# Patient Record
Sex: Female | Born: 1995 | Race: White | Hispanic: No | Marital: Married | Smoking: Current every day smoker
Health system: Southern US, Community
[De-identification: ages and names within clinical notes are randomized; demographics above are authoritative.]

## PROBLEM LIST (undated history)

## (undated) ENCOUNTER — Inpatient Hospital Stay: Payer: Self-pay

## (undated) DIAGNOSIS — F419 Anxiety disorder, unspecified: Secondary | ICD-10-CM

## (undated) DIAGNOSIS — G5600 Carpal tunnel syndrome, unspecified upper limb: Secondary | ICD-10-CM

## (undated) DIAGNOSIS — F329 Major depressive disorder, single episode, unspecified: Secondary | ICD-10-CM

## (undated) DIAGNOSIS — I10 Essential (primary) hypertension: Secondary | ICD-10-CM

## (undated) DIAGNOSIS — F32A Depression, unspecified: Secondary | ICD-10-CM

## (undated) HISTORY — DX: Essential (primary) hypertension: I10

## (undated) HISTORY — PX: APPENDECTOMY: SHX54

---
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
## 2015-02-13 ENCOUNTER — Emergency Department: Payer: Self-pay | Admitting: Emergency Medicine

## 2015-04-07 NOTE — H&P (Signed)
Subjective/Chief Complaint abdominal pain   History of Present Illness 16 yowf w/ 24 hour h/o RLQ abdominal pain and anorexia. Last meal 24 hours ago. No nausea until after CT tonight. No fever. Has some pain with urination.   Past History Klebsiella UTI 12/2012   Past Med/Surgical Hx:  Scoliosis:   Anxiety:   Attention Deficit Hyperactivity Disorder:   left arm fx:   Tonsillectomy and Adenoidectomy:   ALLERGIES:  No Known Allergies:   HOME MEDICATIONS: Medication Instructions Status  FLUoxetine 20 mg oral tablet 1 tab(s) orally once a day Active  Cipro 250 mg oral tablet 1 tab(s) orally every 12 hours Active   Family and Social History:  Family History Non-Contributory   Social History negative tobacco, negative ETOH, negative Illicit drugs   Place of Living Home   Review of Systems:  Fever/Chills No   Cough No   Sputum No   Abdominal Pain Yes   Diarrhea No   Constipation No   Nausea/Vomiting Yes   SOB/DOE No   Chest Pain No   Dysuria Yes   Tolerating PT Yes   Tolerating Diet No  Nauseated   Medications/Allergies Reviewed Medications/Allergies reviewed   Physical Exam:  GEN well developed, well nourished, obese   HEENT pink conjunctivae, PERRL, hearing intact to voice, moist oral mucosa   NECK supple  No masses  thyroid not tender  trachea midline   RESP normal resp effort  clear BS   CARD regular rate  no murmur  no JVD  no Rub   ABD positive tenderness  soft  mild RLQ tenderness   LYMPH negative neck   EXTR negative cyanosis/clubbing, negative edema   SKIN normal to palpation, No ulcers, skin turgor good   NEURO cranial nerves intact, follows commands, motor/sensory function intact   PSYCH alert, A+O to time, place, person   Lab Results: Hepatic:  05-Mar-14 14:36   Bilirubin, Total 0.5  Alkaline Phosphatase 107  SGPT (ALT) 19  SGOT (AST) 18  Total Protein, Serum 7.8  Albumin, Serum 4.0  Routine Chem:  05-Mar-14 14:36    Glucose, Serum 81  BUN 12  Creatinine (comp) 0.69  Sodium, Serum 139  Potassium, Serum 3.8  Chloride, Serum  111  CO2, Serum 22  Calcium (Total), Serum 9.1  Osmolality (calc) 276  Anion Gap  6 (Result(s) reported on 17 Feb 2013 at 03:03PM.)  Lipase 115 (Result(s) reported on 17 Feb 2013 at 02:59PM.)  Routine UA:  05-Mar-14 14:36   Color (UA) Amber  Clarity (UA) Cloudy  Glucose (UA) Negative  Bilirubin (UA) Negative  Ketones (UA) Negative  Specific Gravity (UA) 1.027  Blood (UA) 2+  pH (UA) 6.0  Protein (UA) 30 mg/dL  Nitrite (UA) Positive  Leukocyte Esterase (UA) Trace (Result(s) reported on 17 Feb 2013 at 04:13PM.)  RBC (UA) 3 /HPF  WBC (UA) 17 /HPF  Bacteria (UA) 3+  Epithelial Cells (UA) 4 /HPF  Mucous (UA) PRESENT (Result(s) reported on 17 Feb 2013 at 04:13PM.)  Routine Sero:  05-Mar-14 14:36   Pregnancy Test, Urine NEGATIVE (The results of the qualitative urine HCG (Pregnancy Test) should be evaluated in light of other clinical information.  There are limitations to the test which, in certain clinical situations, may result in a false positive or negative result. Thehigh dose hook effect can occur in urine samples with extremely high HCG concentrations.  This effect can produce a negative result in certain situations. It is suggested that results of the qualitative  HCG be confirmed by an alternate methodology, such as the quantitative serum beta HCG test.)  Routine Hem:  05-Mar-14 14:36   WBC (CBC)  11.8  RBC (CBC) 5.06  Hemoglobin (CBC) 14.4  Hematocrit (CBC) 42.3  Platelet Count (CBC) 198 (Result(s) reported on 17 Feb 2013 at 03:07PM.)  MCV 84  MCH 28.5  MCHC 34.1  RDW 12.7   Radiology Results: [place]:    05-Mar-14 17:14, US Abdomen Limited Survey  US Abdomen Limited Survey  REASON FOR EXAM:    RLQ pain, r/o appendicitis  COMMENTS:   Body Site: Appendix/Bowel    PROCEDURE: [place]  - US ABDOMEN LIMITED SURVEY  - Feb 17 2013  5:14PM     RESULT: History:  Right lower quadrant pain    Comparison: None    Technique: Real-time sonography of the right lower quadrant was performed   with a high-frequency linear transducer.    There is a tubular blind-ending structure in the right lower quadrant.   The structure is felt to the appendix. The appendix measures 7.5 mm in   diameter. There is no periappendiceal free fluid. There is no right lower     quadrant lymphadenopathy. There is no appendicolith. There is no focal   fluid collection to suggest an abscess.    IMPRESSION:     Mildly dilated appendix without periappendiceal free fluid or right lower   quadrant free fluid. The examination is equivocal for appendicitis. If   there is further clinical concern recommend a CT of the abdomen with oral   and intravenous contrast.        Verified By: Joellyn Haff, M.D., MD  LabUnknown:  PACS Image    05-Mar-14 21:00, CT Abdomen and Pelvis With Contrast  PACS Image  CT:  CT Abdomen and Pelvis With Contrast  REASON FOR EXAM:    (1) rlq p[aIN; (2) RLQ PAIN  COMMENTS:       PROCEDURE: CT  - CT ABDOMEN / PELVIS  W  - Feb 17 2013  9:00PM     RESULT: History: Right lower quadrant pain    Comparison:  None    Technique: Multiple axial images of the abdomen andpelvis were performed   from the lung bases to the pubic symphysis, with p.o. contrast and with   100 ml of Isovue 300 intravenous contrast.    Findings:  The lung bases are clear. There is no pneumothorax. The heart size is   normal.     The liver demonstrates no focal abnormality. There is no intrahepatic or   extrahepatic biliary ductal dilatation. The gallbladder is unremarkable.   The spleen demonstrates no focal abnormality. The kidneys, adrenal   glands, and pancreas are normal. The bladder is unremarkable.     The stomach, duodenum, small intestine, and large intestine demonstrate   no contrast extravasation or dilatation. The appendix is dilated   measuring 10 mm in  diameter with mild hazy changes in the periappendiceal   fat, concerning for early appendicitis. There is no pneumoperitoneum,   pneumatosis, or portal venous gas. There is no abdominal or pelvic free   fluid. There are numerous nonpathologically enlarged mesenteric and right   lower quadrant lymph nodes likely reactive.  The abdominal aorta is normal in caliber .    The osseous structures are unremarkable.    IMPRESSION:     1. The appendix is dilated measuring 10 mm in diameter with mild hazy   changes in the periappendiceal  fat, concerning for early appendicitis.    Dictation Site: 1        Verified By: Joellyn HaffHETAL P. PATEL, M.D., MD    Assessment/Admission Diagnosis Early Acute Appendicitis   Plan Laparoscopic Appendectomy   Electronic Signatures: Claude MangesMarterre, William F (MD)  (Signed 05-Mar-14 21:42)  Authored: CHIEF COMPLAINT and HISTORY, PAST MEDICAL/SURGIAL HISTORY, ALLERGIES, HOME MEDICATIONS, FAMILY AND SOCIAL HISTORY, REVIEW OF SYSTEMS, PHYSICAL EXAM, LABS, Radiology, ASSESSMENT AND PLAN   Last Updated: 05-Mar-14 21:42 by Claude MangesMarterre, William F (MD)

## 2015-04-07 NOTE — Op Note (Signed)
PATIENT NAME:  Franchot ErichsenKERNODLE, Brooke Bauer DATE OF BIRTH:  July 02, 1996  DATE OF PROCEDURE:  02/17/2013  OPERATION PERFORMED:  Laparoscopic appendectomy.   PREOPERATIVE DIAGNOSIS:  Acute appendicitis.   POSTOPERATIVE DIAGNOSIS:  Acute appendicitis, early.   SURGEON:  Claude MangesWilliam F. Marterre, M.D.   ANESTHESIA:  General.   PROCEDURE IN DETAIL:  The patient was placed supine on the operating room table, prepped and draped in the usual sterile fashion.  A one-inch incision was made in the supraumbilical midline and this was carried down through the extensive subcutaneous tissue to the linea alba which was opened and a Hassan cannula was introduced amidst horizontal mattress sutures of 0 Vicryl and a 15 mmHg CO2 pneumoperitoneum was created.  Two additional 5 mm trocars were placed under direct visualization.  The appendix was mildly dilated with a very small amount of exudate and the mesoappendix was taken down with the Harmonic scalpel and the appendectomy was performed with an Endo GIA stapling device and the appendix was placed in an Endo Catch bag and extracted from the abdomen via the supraumbilical midline port.  The right lower quadrant and pelvis were irrigated and this was suctioned clear.  Hemostasis was excellent and the peritoneum was desufflated and decannulated and the linea alba was closed with a running 0 PDS suture and the previously placed Vicryls were tied one to another.  All three skin sites were closed with subcuticular 5-0 Monocryl and suture strips.  The patient tolerated the procedure well.  There were no complications.    ____________________________ Claude MangesWilliam F. Marterre, MD wfm:ea D: 02/18/2013 00:18:31 ET T: 02/18/2013 00:40:30 ET JOB#: [id number]  cc: Claude MangesWilliam F. Marterre, MD, <Dictator> Claude MangesWilliam F. Marterre, MD Claude MangesWILLIAM F MARTERRE MD ELECTRONICALLY SIGNED 02/20/2013 20:22

## 2015-12-05 ENCOUNTER — Emergency Department: Payer: Medicaid Other

## 2015-12-05 ENCOUNTER — Encounter: Payer: Self-pay | Admitting: *Deleted

## 2015-12-05 ENCOUNTER — Emergency Department
Admission: EM | Admit: 2015-12-05 | Discharge: 2015-12-05 | Disposition: A | Discharge status: Home / Self Care | Payer: Medicaid Other | Attending: Emergency Medicine | Admitting: Emergency Medicine

## 2015-12-05 DIAGNOSIS — B9689 Other specified bacterial agents as the cause of diseases classified elsewhere: Secondary | ICD-10-CM

## 2015-12-05 DIAGNOSIS — O23591 Infection of other part of genital tract in pregnancy, first trimester: Secondary | ICD-10-CM | POA: Insufficient documentation

## 2015-12-05 DIAGNOSIS — O26899 Other specified pregnancy related conditions, unspecified trimester: Secondary | ICD-10-CM

## 2015-12-05 DIAGNOSIS — F17210 Nicotine dependence, cigarettes, uncomplicated: Secondary | ICD-10-CM | POA: Diagnosis not present

## 2015-12-05 DIAGNOSIS — O99331 Smoking (tobacco) complicating pregnancy, first trimester: Secondary | ICD-10-CM | POA: Diagnosis not present

## 2015-12-05 DIAGNOSIS — Z3A01 Less than 8 weeks gestation of pregnancy: Secondary | ICD-10-CM | POA: Insufficient documentation

## 2015-12-05 DIAGNOSIS — N760 Acute vaginitis: Secondary | ICD-10-CM

## 2015-12-05 DIAGNOSIS — O9989 Other specified diseases and conditions complicating pregnancy, childbirth and the puerperium: Secondary | ICD-10-CM | POA: Diagnosis present

## 2015-12-05 DIAGNOSIS — R109 Unspecified abdominal pain: Secondary | ICD-10-CM

## 2015-12-05 LAB — CHLAMYDIA/NGC RT PCR (ARMC ONLY)
CHLAMYDIA TR: NOT DETECTED
N GONORRHOEAE: NOT DETECTED

## 2015-12-05 LAB — LIPASE, BLOOD: Lipase: 21 U/L (ref 11–51)

## 2015-12-05 LAB — COMPREHENSIVE METABOLIC PANEL
ALBUMIN: 4.2 g/dL (ref 3.5–5.0)
ALK PHOS: 59 U/L (ref 38–126)
ALT: 14 U/L (ref 14–54)
ANION GAP: 7 (ref 5–15)
AST: 15 U/L (ref 15–41)
BUN: 18 mg/dL (ref 6–20)
CO2: 21 mmol/L — AB (ref 22–32)
Calcium: 9.3 mg/dL (ref 8.9–10.3)
Chloride: 109 mmol/L (ref 101–111)
Creatinine, Ser: 0.54 mg/dL (ref 0.44–1.00)
GFR calc non Af Amer: >60 mL/min (ref >60)
GLUCOSE: 85 mg/dL (ref 65–99)
POTASSIUM: 3.3 mmol/L — AB (ref 3.5–5.1)
Sodium: 137 mmol/L (ref 135–145)
TOTAL PROTEIN: 7.4 g/dL (ref 6.5–8.1)
Total Bilirubin: 0.3 mg/dL (ref 0.3–1.2)

## 2015-12-05 LAB — CBC
HEMATOCRIT: 39.9 % (ref 35.0–47.0)
HEMOGLOBIN: 13.4 g/dL (ref 12.0–16.0)
MCH: 28.9 pg (ref 26.0–34.0)
MCHC: 33.7 g/dL (ref 32.0–36.0)
MCV: 85.7 fL (ref 80.0–100.0)
Platelets: 194 10*3/uL (ref 150–440)
RBC: 4.66 MIL/uL (ref 3.80–5.20)
RDW: 12.5 % (ref 11.5–14.5)
WBC: 14.7 10*3/uL — ABNORMAL HIGH (ref 3.6–11.0)

## 2015-12-05 LAB — URINALYSIS COMPLETE WITH MICROSCOPIC (ARMC ONLY)
BACTERIA UA: NONE SEEN
Bilirubin Urine: NEGATIVE
Glucose, UA: NEGATIVE mg/dL
Hgb urine dipstick: NEGATIVE
KETONES UR: NEGATIVE mg/dL
Leukocytes, UA: NEGATIVE
NITRITE: NEGATIVE
PH: 6 (ref 5.0–8.0)
PROTEIN: NEGATIVE mg/dL
SPECIFIC GRAVITY, URINE: 1.027 (ref 1.005–1.030)

## 2015-12-05 LAB — WET PREP, GENITAL
Sperm: NONE SEEN
Trich, Wet Prep: NONE SEEN
WBC, Wet Prep HPF POC: NONE SEEN
Yeast Wet Prep HPF POC: NONE SEEN

## 2015-12-05 LAB — HCG, QUANTITATIVE, PREGNANCY: HCG, BETA CHAIN, QUANT, S: 24796 m[IU]/mL — AB (ref <5)

## 2015-12-05 MED ORDER — METRONIDAZOLE 500 MG PO TABS: 500.0000 mg | ORAL_TABLET | Freq: Two times a day (BID) | ORAL | Status: AC

## 2015-12-05 MED ORDER — METRONIDAZOLE 500 MG PO TABS
500.0000 mg | ORAL_TABLET | Freq: Once | ORAL | Status: AC
  Administered 2015-12-05: 500 mg via ORAL

## 2015-12-05 MED ORDER — RANITIDINE HCL 75 MG PO TABS: 75.0000 mg | ORAL_TABLET | Freq: Two times a day (BID) | ORAL | Status: DC

## 2015-12-05 MED ORDER — METRONIDAZOLE 500 MG PO TABS
ORAL_TABLET | ORAL | Status: AC
  Filled 2015-12-05: qty 1

## 2015-12-05 MED ORDER — ALUM & MAG HYDROXIDE-SIMETH 200-200-20 MG/5ML PO SUSP
30.0000 mL | Freq: Once | ORAL | Status: AC
  Administered 2015-12-05: 30 mL via ORAL
  Filled 2015-12-05: qty 30

## 2015-12-05 NOTE — ED Notes (Signed)
Pt has appointment for first ultrasound on 12/26, pt states [facility] is OB/GYN.

## 2015-12-05 NOTE — Discharge Instructions (Signed)

## 2015-12-05 NOTE — ED Provider Notes (Signed)
[facility] Emergency Department Provider Note  ____________________________________________  Time seen: Approximately 730 PM  I have reviewed the triage vital signs and the nursing notes.   HISTORY  Chief Complaint Abdominal Pain    HPI Brooke Bauer is a 19 y.o. female who is a G1 P0 who is presenting today with upper abdominal pain in pregnancy. The patient says that she is about [redacted] weeks pregnant and has not had an ultrasound yet. She denies any history of STDs. Denies any vaginal bleeding or discharge. Denies any pain with urination. Says the pain feels dull and is more to the left upper quadrant. Denies any nausea or vomiting.Patient is taking prenatal vitamins at home.   History reviewed. No pertinent past medical history.  There are no active problems to display for this patient.   History reviewed. No pertinent past surgical history.  No current outpatient prescriptions on file.  Allergies Review of patient's allergies indicates no known allergies.  No family history on file.  Social History Social History  Substance Use Topics  . Smoking status: Current Every Day Smoker -- 0.50 packs/day    Types: Cigarettes  . Smokeless tobacco: None  . Alcohol Use: No    Review of Systems Constitutional: No fever/chills Eyes: No visual changes. ENT: No sore throat. Cardiovascular: Denies chest pain. Respiratory: Denies shortness of breath. Gastrointestinal:  No nausea, no vomiting.  No diarrhea.  No constipation. Genitourinary: Negative for dysuria. Musculoskeletal: Negative for back pain. Skin: Negative for rash. Neurological: Negative for headaches, focal weakness or numbness.  10-point ROS otherwise negative.  ____________________________________________   PHYSICAL EXAM:  VITAL SIGNS: ED Triage Vitals  Enc Vitals Group     BP 12/05/15 1840 159/74 mmHg     Pulse Rate 12/05/15 1840 90     Resp 12/05/15 1840 20     Temp 12/05/15  1840 97.6 F (36.4 C)     Temp Source 12/05/15 1840 Oral     SpO2 12/05/15 1840 100 %     Weight 12/05/15 1840 210 lb (95.255 kg)     Height 12/05/15 1840  (1.6 m)     Head Cir --      Peak Flow --      Pain Score 12/05/15 1840 8     Pain Loc --      Pain Edu? --      Excl. in GC? --     Constitutional: Alert and oriented. Well appearing and in no acute distress. Eyes: Conjunctivae are normal. PERRL. EOMI. Head: Atraumatic. Nose: No congestion/rhinnorhea. Mouth/Throat: Mucous membranes are moist.   Neck: No stridor.   Cardiovascular: Normal rate, regular rhythm. Grossly normal heart sounds.  Good peripheral circulation. Respiratory: Normal respiratory effort.  No retractions. Lungs CTAB. Gastrointestinal: Soft with mild tenderness to the left upper quadrant. No distention.  No CVA tenderness. Genitourinary: Normal external appearance. Speculum exam with a small amount of white discharge without any bleeding. Bimanual exam with a closed cervical os. There is no CMT, uterine tenderness, adnexal tenderness or masses palpated. Musculoskeletal: No lower extremity tenderness nor edema.  No joint effusions. Neurologic:  Normal speech and language. No gross focal neurologic deficits are appreciated. No gait instability. Skin:  Skin is warm, dry and intact. No rash noted. Psychiatric: Mood and affect are normal. Speech and behavior are normal.  ____________________________________________   LABS (all labs ordered are listed, but only abnormal results are displayed)  Labs Reviewed  WET PREP, GENITAL - Abnormal; Notable for  the following:    Clue Cells Wet Prep HPF POC FEW (*)    All other components within normal limits  COMPREHENSIVE METABOLIC PANEL - Abnormal; Notable for the following:    Potassium 3.3 (*)    CO2 21 (*)    All other components within normal limits  CBC - Abnormal; Notable for the following:    WBC 14.7 (*)    All other components within normal limits   URINALYSIS COMPLETEWITH MICROSCOPIC ([facility] ONLY) - Abnormal; Notable for the following:    Color, Urine YELLOW (*)    APPearance HAZY (*)    Squamous Epithelial / LPF 0-5 (*)    All other components within normal limits  HCG, QUANTITATIVE, PREGNANCY - Abnormal; Notable for the following:    hCG, Beta Chain, Quant, S [id number] (*)    All other components within normal limits  CHLAMYDIA/NGC RT PCR ([facility] ONLY)  LIPASE, BLOOD   ____________________________________________  EKG   ____________________________________________  RADIOLOGY  Single live IUP with an estimated gestational age of [redacted] weeks and 3 days. ____________________________________________   PROCEDURES    ____________________________________________   INITIAL IMPRESSION / ASSESSMENT AND PLAN / ED COURSE  Pertinent labs & imaging results that were available during my care of the patient were reviewed by me and considered in my medical decision making (see chart for details).  ----------------------------------------- 10:39 PM on 12/05/2015 -----------------------------------------  Patient's blood pressure retaken is 134/70 at this time. The patient reports relief of her pain after taking the Maalox. I updated her about her ultrasound results as well as the few clue cells on her swabs which indicate bacterial vaginosis. I'll give her a course of Flagyl. Also write her for Zantac for likely gastritis. She has [facility] ____________________________________________   FINAL CLINICAL IMPRESSION(S) / ED DIAGNOSES  Abdominal pain and pregnancy. Bacterial vaginosis.    Myrna Blazeravid Matthew Schaevitz, MD 12/05/15 [phone]

## 2015-12-05 NOTE — ED Notes (Signed)
Confirmed blood sent to lab.

## 2015-12-05 NOTE — ED Notes (Signed)
Pt is [redacted] weeks pregnant, pt complains of abdominal pain with nausea, pt denies vaginal bleeding

## 2015-12-17 NOTE — L&D Delivery Note (Signed)
Obstetrical Delivery Note   Date of Delivery:   07/29/2016 Primary OB:   [facility] Gestational Age/EDD: 5741w1d  Antepartum complications: none  Delivered By:   TKB, CNM  Delivery Type:   spontaneous vaginal delivery  Delivery Details:   Approx 30 min second stage. Infant to maternal abdomen for delayed cord clamping, cut by FOB after pulsation stopped. Infant to skin-to-skin with mother.   Baby's Name: Lilly Anesthesia:    epidural Intrapartum complications: None GBS:    Negative Laceration:    vaginal Episiotomy:    none Placenta:    Delivered and expressed via active management. Intact: yes. To pathology: no.  Delayed Cord Clamping: yes Estimated Blood Loss:  250  Baby:    Liveborn female, APGARs 8/9, weight 2830gm

## 2015-12-25 ENCOUNTER — Emergency Department
Admission: EM | Admit: 2015-12-25 | Discharge: 2015-12-25 | Disposition: A | Discharge status: Home / Self Care | Payer: Medicaid Other | Attending: Emergency Medicine | Admitting: Emergency Medicine

## 2015-12-25 DIAGNOSIS — F17210 Nicotine dependence, cigarettes, uncomplicated: Secondary | ICD-10-CM | POA: Insufficient documentation

## 2015-12-25 DIAGNOSIS — O99511 Diseases of the respiratory system complicating pregnancy, first trimester: Secondary | ICD-10-CM | POA: Diagnosis not present

## 2015-12-25 DIAGNOSIS — J029 Acute pharyngitis, unspecified: Secondary | ICD-10-CM | POA: Insufficient documentation

## 2015-12-25 DIAGNOSIS — Z3A10 10 weeks gestation of pregnancy: Secondary | ICD-10-CM | POA: Insufficient documentation

## 2015-12-25 DIAGNOSIS — Z79899 Other long term (current) drug therapy: Secondary | ICD-10-CM | POA: Diagnosis not present

## 2015-12-25 LAB — POCT RAPID STREP A: Streptococcus, Group A Screen (Direct): NEGATIVE

## 2015-12-25 MED ORDER — LIDOCAINE VISCOUS 2 % MT SOLN
15.0000 mL | Freq: Once | OROMUCOSAL | Status: AC
  Administered 2015-12-25: 15 mL via OROMUCOSAL
  Filled 2015-12-25: qty 15

## 2015-12-25 MED ORDER — MAGIC MOUTHWASH W/LIDOCAINE: 5.0000 mL | Freq: Four times a day (QID) | ORAL | Status: DC

## 2015-12-25 NOTE — Discharge Instructions (Signed)
Pharyngitis Pharyngitis is redness, pain, and swelling (inflammation) of your pharynx.  CAUSES  Pharyngitis is usually caused by infection. Most of the time, these infections are from viruses (viral) and are part of a cold. However, sometimes pharyngitis is caused by bacteria (bacterial). Pharyngitis can also be caused by allergies. Viral pharyngitis may be spread from person to person by coughing, sneezing, and personal items or utensils (cups, forks, spoons, toothbrushes). Bacterial pharyngitis may be spread from person to person by more intimate contact, such as kissing.  SIGNS AND SYMPTOMS  Symptoms of pharyngitis include:   Sore throat.   Tiredness (fatigue).   Low-grade fever.   Headache.  Joint pain and muscle aches.  Skin rashes.  Swollen lymph nodes.  Plaque-like film on throat or tonsils (often seen with bacterial pharyngitis). DIAGNOSIS  Your health care provider will ask you questions about your illness and your symptoms. Your medical history, along with a physical exam, is often all that is needed to diagnose pharyngitis. Sometimes, a rapid strep test is done. Other lab tests may also be done, depending on the suspected cause.  TREATMENT  Viral pharyngitis will usually get better in 3-4 days without the use of medicine. Bacterial pharyngitis is treated with medicines that kill germs (antibiotics).  HOME CARE INSTRUCTIONS   Drink enough water and fluids to keep your urine clear or pale yellow.   Only take over-the-counter or prescription medicines as directed by your health care provider:   If you are prescribed antibiotics, make sure you finish them even if you start to feel better.   Do not take aspirin.   Get lots of rest.   Gargle with 8 oz of salt water ( tsp of salt per 1 qt of water) as often as every 1-2 hours to soothe your throat.   Throat lozenges (if you are not at risk for choking) or sprays may be used to soothe your throat. SEEK MEDICAL  CARE IF:   You have large, tender lumps in your neck.  You have a rash.  You cough up green, yellow-brown, or bloody spit. SEEK IMMEDIATE MEDICAL CARE IF:   Your neck becomes stiff.  You drool or are unable to swallow liquids.  You vomit or are unable to keep medicines or liquids down.  You have severe pain that does not go away with the use of recommended medicines.  You have trouble breathing (not caused by a stuffy nose). MAKE SURE YOU:   Understand these instructions.  Will watch your condition.  Will get help right away if you are not doing well or get worse.   This information is not intended to replace advice given to you by your health care provider. Make sure you discuss any questions you have with your health care provider.   Document Released: 12/02/2005 Document Revised: 09/22/2013 Document Reviewed: 08/09/2013 Elsevier Interactive Patient Education 2016 [employer]  Sore Throat A sore throat is pain, burning, irritation, or scratchiness of the throat. There is often pain or tenderness when swallowing or talking. A sore throat may be accompanied by other symptoms, such as coughing, sneezing, fever, and swollen neck glands. A sore throat is often the first sign of another sickness, such as a cold, flu, strep throat, or mononucleosis (commonly known as mono). Most sore throats go away without medical treatment. CAUSES  The most common causes of a sore throat include:  A viral infection, such as a cold, flu, or mono.  A bacterial infection, such as strep throat,  tonsillitis, or whooping cough. °· Seasonal allergies. °· Dryness in the air. °· Irritants, such as smoke or pollution. °· Gastroesophageal reflux disease (GERD). °HOME CARE INSTRUCTIONS  °· Only take over-the-counter medicines as directed by your caregiver. °· Drink enough fluids to keep your urine clear or pale yellow. °· Rest as needed. °· Try using throat sprays, lozenges, or sucking on hard candy to ease  any pain (if older than 4 years or as directed). °· Sip warm liquids, such as broth, herbal tea, or warm water with honey to relieve pain temporarily. You may also eat or drink cold or frozen liquids such as frozen ice pops. °· Gargle with salt water (mix 1 tsp salt with 8 oz of water). °· Do not smoke and avoid secondhand smoke. °· Put a cool-mist humidifier in your bedroom at night to moisten the air. You can also turn on a hot shower and sit in the bathroom with the door closed for 5-10 minutes. °SEEK IMMEDIATE MEDICAL CARE IF: °· You have difficulty breathing. °· You are unable to swallow fluids, soft foods, or your saliva. °· You have increased swelling in the throat. °· Your sore throat does not get better in 7 days. °· You have nausea and vomiting. °· You have a fever or persistent symptoms for more than 2-3 days. °· You have a fever and your symptoms suddenly get worse. °MAKE SURE YOU:  °· Understand these instructions. °· Will watch your condition. °· Will get help right away if you are not doing well or get worse. °  °This information is not intended to replace advice given to you by your health care provider. Make sure you discuss any questions you have with your health care provider. °  °Document Released: 01/09/2005 Document Revised: 12/23/2014 Document Reviewed: 08/09/2012 °Elsevier Interactive Patient Education ©2016 [employer] Inc. ° °

## 2015-12-25 NOTE — ED Provider Notes (Signed)
[facility] Emergency Department Provider Note ?  ? ____________________________________________ ? Time seen: 10:57 PM ? I have reviewed the triage vital signs and the nursing notes.  ________ HISTORY ? Chief Complaint Sore Throat     HPI  [name] is a 20 y.o. female   who presents to emergency department complaining of sore throat 3 days. Patient states that she is [redacted] weeks pregnant but does not have any abdominal pain, vaginal discharge, vaginal bleeding. She states that sore throat is sharp in nature, she does have some sharp pain with swallowing. Patient denies any fevers or chills, difficulty breathing, chest pain, vomiting, nausea or vomiting. She denies nasal congestion, ear pain. ? ? ? No past medical history on file.  There are no active problems to display for this patient.  ? No past surgical history on file. ? Current Outpatient Rx  Name  Route  Sig  Dispense  Refill  . magic mouthwash w/lidocaine SOLN   Oral   Take 5 mLs by mouth 4 (four) times daily. Swish, gargle, spit.   240 mL   0     Dispense in a 1/1/1/1 ratio. Use lidocaine, diphen ...   . ranitidine (ZANTAC) 75 MG tablet   Oral   Take 1 tablet (75 mg total) by mouth 2 (two) times daily.   60 tablet   0    ? Allergies Review of patient's allergies indicates no known allergies. ? No family history on file. ? Social History Social History  Substance Use Topics  . Smoking status: Current Every Day Smoker -- 0.50 packs/day    Types: Cigarettes  . Smokeless tobacco: Not on file  . Alcohol Use: No   ? Review of Systems Constitutional: no fever. Eyes: no discharge ENT: Positive sore throat. Cardiovascular: no chest pain. Respiratory: no cough. No sob Gastrointestinal: denies abdominal pain, vomiting, diarrhea, and constipation Genitourinary: no dysuria. Negative for hematuria Musculoskeletal: Negative for back pain. Skin: Negative for  rash. Neurological: Negative for headaches  10-point ROS otherwise negative.  _______________ PHYSICAL EXAM: ? VITAL SIGNS:   ED Triage Vitals  Enc Vitals Group     BP 12/25/15 2110 138/71 mmHg     Pulse Rate 12/25/15 2110 108     Resp 12/25/15 2110 18     Temp 12/25/15 2110 97.4 F (36.3 C)     Temp Source 12/25/15 2110 Oral     SpO2 12/25/15 2110 99 %     Weight 12/25/15 2110 208 lb (94.348 kg)     Height 12/25/15 2110 5\' 3"  (1.6 m)     Head Cir --      Peak Flow --      Pain Score 12/25/15 2110 5     Pain Loc --      Pain Edu? --      Excl. in GC? --    ?  Constitutional: Alert and oriented. Well appearing and in no distress. Eyes: Conjunctivae are normal.  ENT      Head: Normocephalic and atraumatic.      Ears: EACs and TMs are unremarkable bilaterally.       Nose: No congestion/rhinnorhea.      Mouth/Throat: Mucous membranes are moist.  oropharynx is erythematous. Tonsils are unremarkable. Uvula is midline. Some mild enlargement of the lingular tonsils. Hematological/Lymphatic/Immunilogical: Diffuse, mild, nontender anterior cervical lymphadenopathy. Cardiovascular: Normal rate, regular rhythm. Normal S1 and S2. Respiratory: Normal respiratory effort without tachypnea nor retractions. Lungs CTAB. Gastrointestinal: Soft and nontender.  No distention. There is no CVA tenderness. Genitourinary:  Musculoskeletal: Nontender with normal range of motion in all extremities.  Neurologic:  Normal speech and language. No gross focal neurologic deficits are appreciated. Skin:  Skin is warm, dry and intact. No rash noted. Psychiatric: Mood and affect are normal. Speech and behavior are normal. Patient exhibits appropriate insight and judgment.    LABS (all labs ordered are listed, but only abnormal results are displayed)  Labs Reviewed  CULTURE, GROUP A STREP ([facility] ONLY)  POCT RAPID STREP A    ___________ RADIOLOGY    _____________ PROCEDURES ? Procedure(s)  performed:    Medications  lidocaine (XYLOCAINE) 2 % viscous mouth solution 15 mL (15 mLs Mouth/Throat Given 12/25/15 2151)    ______________________________________________________ INITIAL IMPRESSION / ASSESSMENT AND PLAN / ED COURSE ? Pertinent labs & imaging results that were available during my care of the patient were reviewed by me and considered in my medical decision making (see chart for details).    Patient presented to the emergency department with a complaint of sore throat. Strep test in the emergency department was negative. Patient did not meet [facility] criteria for strep throat. Culture was sent and patient will be informed if this returns positive. Physical exam is unremarkable for any acute abnormality. There is some mild enlargement of the lingular tonsils. Patient was given viscous lidocaine to gargle and then given a fluid challenge. Patient tolerated same. Patient is in no respiratory distress. There is no signs of peritonsillar abscess. Patient is given strict instructions to follow-up with emergency department for any worsening of her symptoms. She verbalizes understanding of same. Patient will be discharged with Magic mouthwash for symptom control. Advised that she may use Tylenol but no NSAIDs due to her pregnancy status.    New Prescriptions   MAGIC MOUTHWASH W/LIDOCAINE SOLN    Take 5 mLs by mouth 4 (four) times daily. Swish, gargle, spit.   ____________________________________________ FINAL CLINICAL IMPRESSION(S) / ED DIAGNOSES?  Final diagnoses:  Pharyngitis            Racheal Patches, PA-C 12/25/15 2257  Governor Rooks, MD 12/25/15 [phone]

## 2015-12-25 NOTE — ED Notes (Signed)
Strep Test, Negative. Culture sent to lab.

## 2015-12-25 NOTE — ED Notes (Addendum)
Sore throat since Friday pt is 10 weeks pregnancy but has not pregnancy concerns.

## 2015-12-25 NOTE — ED Notes (Signed)
Pt provided apple juice for PO challenge per pt request.

## 2015-12-25 NOTE — ED Notes (Signed)
Pt states she is able to drink apple juice. Pt in no distress at this time.

## 2015-12-28 LAB — CULTURE, GROUP A STREP (THRC)

## 2015-12-30 NOTE — Progress Notes (Signed)
ER Culture follow-up:  ER Throat culture from 12/25/15 with moderate Group B Strep. Clovia CuffLisa Kluttz RPh spoke with Dr. Sharyn CreamerMark Quale who recommended treatment as patient is pregnant. Spoke with patient- [facility]- however they are closed at present time. OK with patient to call Rx into [facility] on [address]. Called Rx for Amoxicillin 500mg  po TID x 10 days approved by Sharyn CreamerMark Quale MD.  Bari MantisKristin Lashonta Pilling PharmD Clinical Pharmacist 12/30/2015 5:26 PM

## 2016-03-06 ENCOUNTER — Emergency Department
Admission: EM | Admit: 2016-03-06 | Discharge: 2016-03-06 | Disposition: A | Discharge status: Home / Self Care | Payer: Medicaid Other | Attending: Emergency Medicine | Admitting: Emergency Medicine

## 2016-03-06 ENCOUNTER — Encounter: Payer: Self-pay | Admitting: Medical Oncology

## 2016-03-06 ENCOUNTER — Emergency Department: Payer: Medicaid Other

## 2016-03-06 DIAGNOSIS — N939 Abnormal uterine and vaginal bleeding, unspecified: Secondary | ICD-10-CM | POA: Diagnosis not present

## 2016-03-06 DIAGNOSIS — O200 Threatened abortion: Secondary | ICD-10-CM | POA: Diagnosis not present

## 2016-03-06 DIAGNOSIS — O368120 Decreased fetal movements, second trimester, not applicable or unspecified: Secondary | ICD-10-CM | POA: Diagnosis present

## 2016-03-06 DIAGNOSIS — F17210 Nicotine dependence, cigarettes, uncomplicated: Secondary | ICD-10-CM | POA: Diagnosis not present

## 2016-03-06 DIAGNOSIS — R109 Unspecified abdominal pain: Secondary | ICD-10-CM | POA: Insufficient documentation

## 2016-03-06 DIAGNOSIS — Z3A19 19 weeks gestation of pregnancy: Secondary | ICD-10-CM | POA: Insufficient documentation

## 2016-03-06 LAB — URINALYSIS COMPLETE WITH MICROSCOPIC (ARMC ONLY)
Bilirubin Urine: NEGATIVE
GLUCOSE, UA: NEGATIVE mg/dL
Hgb urine dipstick: NEGATIVE
Ketones, ur: NEGATIVE mg/dL
LEUKOCYTES UA: NEGATIVE
NITRITE: NEGATIVE
Protein, ur: NEGATIVE mg/dL
SPECIFIC GRAVITY, URINE: 1.019 (ref 1.005–1.030)
pH: 6 (ref 5.0–8.0)

## 2016-03-06 LAB — HCG, QUANTITATIVE, PREGNANCY: hCG, Beta Chain, Quant, S: 9946 m[IU]/mL — ABNORMAL HIGH (ref <5)

## 2016-03-06 LAB — ABO/RH: ABO/RH(D): A POS

## 2016-03-06 NOTE — ED Notes (Signed)
Pt reports that she is [redacted]weeks pregnant with first pregnancy. States about 1 hour pta she began having some lower abd cramping and then about 30 min later she began having vaginal bleeding. Pt uses [facility] for her prenatal care. Pts EDD is 8/21.

## 2016-03-06 NOTE — Consult Note (Signed)
Obstetrics & Gynecology Consultation Note  Date of Consultation: 03/06/2016   [facility]  [facility] Primary Care Provider: Pcp Not In System  Reason for Consultation: vaginal bleeding in pregnancy  History of Present Illness: Ms. Brooke Bauer is a 20 y.o. G1 @ 19/3 (EDC 8/13, dated by 8wk u/s), with the above CC. PMHx is significant for tobacco abuse, BMI 36, elevated DS risk on 1st trimester testing with negative informaseq and AFP. She went to the bathroom and noticed some bleeding with wiping at around 1720 on 3/22. Total amount was about a quarter size and it seemed like BRB. She had some cramps around that time; given this she came to the ER but has been asymptomatic since presentation, in terms of bleeding and pain. ER obtained u/s prior to consult and it showed a SLIUP, and the report states that the cervix appears "closed." I looked at the images and TAUS cx length is 2.73cm with normal AF, FHR and normal FL.  Nothing in the vagina in the past 1-2 days. No fevers, chills, nausea, vomiting, abdominal pain, dysuria.  ROS: A 12-point review of systems was performed and negative, except as stated in the above HPI.  OBGYN History: As per HPI. OB History    Gravida Para Term Preterm AB TAB SAB Ectopic Multiple Living   1              History of abnormal pap smears: No.  History of STIs: No   Past Medical History: History reviewed. No pertinent past medical history.  Past Surgical History: History reviewed. No pertinent past surgical history.  Family History:  No family history on file.  Social History:  Social History   Social History  . Marital Status: Married    Spouse Name: N/A  . Number of Children: N/A  . Years of Education: N/A   Occupational History  . Not on file.   Social History Main Topics  . Smoking status: Current Every Day Smoker -- 0.50 packs/day    Types: Cigarettes  . Smokeless tobacco: Not on file  . Alcohol Use: No   . Drug Use: Not on file  . Sexual Activity: Not on file   Other Topics Concern  . Not on file   Social History Narrative   Allergy: No Known Allergies  Current Outpatient Medications: PNV  Physical Exam:  Current Vital Signs 24h Vital Sign Ranges  T 98.3 F (36.8 C) Temp  Avg: 98.3 F (36.8 C)  Min: 98.3 F (36.8 C)  Max: 98.3 F (36.8 C)  BP 130/66 mmHg BP  Min: 130/66  Max: 130/66  HR 74 Pulse  Avg: 83.5  Min: 74  Max: 93  RR 17 Resp  Avg: 17.5  Min: 17  Max: 18  SaO2 99 % Not Delivered SpO2  Avg: 99.5 %  Min: 99 %  Max: 100 %       24 Hour I/O Current Shift I/O  Time Ins Outs       Body mass index is 34.55 kg/(m^2). General appearance: Well nourished, well developed female in no acute distress.  Cardiovascular:Regular rate and rhythm.  No murmurs, rubs or gallops. Respiratory:  Clear to auscultation bilateral. Normal respiratory effort Abdomen: positive bowel sounds and no masses, hernias; diffusely non tender to palpation, non distended Neuro/Psych:  Normal mood and affect.  Skin:  Warm and dry.  Lymphatic:  No inguinal lymphadenopathy.   Closed/long/high, cervix nttp Speculum exam not done  Laboratory:  A POS  Results for Brooke, Bauer (MRN [id number]) as of 03/07/2016 06:58  Ref. Range 03/06/2016 19:45  Appearance Latest Ref Range: CLEAR  HAZY (A)  Bacteria, UA Latest Ref Range: NONE SEEN  RARE (A)  Bilirubin Urine Latest Ref Range: NEGATIVE  NEGATIVE  Color, Urine Latest Ref Range: YELLOW  YELLOW (A)  Glucose Latest Ref Range: NEGATIVE mg/dL NEGATIVE  Hgb urine dipstick Latest Ref Range: NEGATIVE  NEGATIVE  Hyaline Casts, UA Unknown PRESENT  Ketones, ur Latest Ref Range: NEGATIVE mg/dL NEGATIVE  Leukocytes, UA Latest Ref Range: NEGATIVE  NEGATIVE  Mucous Unknown PRESENT  Nitrite Latest Ref Range: NEGATIVE  NEGATIVE  pH Latest Ref Range: 5.0-8.0  6.0  Protein Latest Ref Range: NEGATIVE mg/dL NEGATIVE  RBC / HPF Latest Ref Range: 0-5 RBC/hpf 0-5   Specific Gravity, Urine Latest Ref Range: 1.005-1.030  1.019  Squamous Epithelial / LPF Latest Ref Range: NONE SEEN  6-30 (A)  WBC, UA Latest Ref Range: 0-5 WBC/hpf 0-5   Imaging:     Study Result     CLINICAL DATA: 20 year old pregnant female with vaginal bleeding.  EXAM: LIMITED OBSTETRIC ULTRASOUND  FINDINGS: Number of Fetuses: 1  Heart Rate: 135 bpm  Movement: Yes  Presentation: Breech  Placental Location: Posterior  Previa: No  Amniotic Fluid (Subjective): Within normal limits.  Femoral length: 2.9cm 19w 0d  MATERNAL FINDINGS:  Cervix: Appears closed.  Uterus/Adnexae: No abnormality visualized.  IMPRESSION: Single live intrauterine pregnancy with an estimated gestational age of [redacted] weeks, 0 days based on this ultrasound. No acute abnormality identified.  This exam is performed on an emergent basis and does not comprehensively evaluate fetal size, dating, or anatomy; follow-up complete OB US should be considered if further fetal assessment is warranted.   Electronically Signed  By: Elgie Collard M.D.  On: 03/06/2016 21:46    Assessment: Brooke Bauer is a 20 y.o. G1 @ 19/3 with threatened abortion with slightly shortened CL on TAUS; pt currently stable  Plan: D/w pt that TAUS is screening and that TVUS needed to confirm CL. Digital exam reassuring. I sent a message to our nurses to have them schedule her a transvaginal u/s for CL and MD visit for 3/23, as patient may be candidate for intervention if CL is really shortened. Pt told to call the office at 0830 if no word from scheduling. Pelvic rest until issue is resolved. Pt amenable to plan  Total time taking care of the patient was 25 minutes, with greater than 50% of the time spent in face to face interaction with the patient.  Cornelia Copa MD [facility] OBGYN Pager [phone]

## 2016-03-06 NOTE — ED Provider Notes (Signed)
[facility] Emergency Department Provider Note  ____________________________________________  Time seen: Approximately 8:02 PM  I have reviewed the triage vital signs and the nursing notes.   HISTORY  Chief Complaint Vaginal Bleeding; Abdominal Pain; and Decreased Fetal Movement    HPI Brooke Bauer is a 20 y.o. female G1 at approximately [redacted] weeks gestation who goes to [facility].  She presents today by private vehicle for evaluation of acute onset of vaginal bleeding.  She reports that she developed some lower abdominal cramping a few hours ago and then about 2 hours prior to arrival she noticed some bright red blood on the toilet tissue after urinating.  She had what she describes as a steady amount of vaginal bleeding which resolved based on her urinating in the emergency department and having no blood on the toilet tissue.  She still feels a mild pressure and cramping in her lower abdomen.  She denies nausea, vomiting, chest pain, shortness of breath, fever, chills.   History reviewed. No pertinent past medical history.  There are no active problems to display for this patient.   History reviewed. No pertinent past surgical history.  Current Outpatient Rx  Name  Route  Sig  Dispense  Refill  . magic mouthwash w/lidocaine SOLN   Oral   Take 5 mLs by mouth 4 (four) times daily. Swish, gargle, spit.   240 mL   0     Dispense in a 1/1/1/1 ratio. Use lidocaine, diphen ...   . ranitidine (ZANTAC) 75 MG tablet   Oral   Take 1 tablet (75 mg total) by mouth 2 (two) times daily.   60 tablet   0     Allergies Review of patient's allergies indicates no known allergies.  No family history on file.  Social History Social History  Substance Use Topics  . Smoking status: Current Every Day Smoker -- 0.50 packs/day    Types: Cigarettes  . Smokeless tobacco: None  . Alcohol Use: No    Review of Systems Constitutional: No fever/chills Eyes: No  visual changes. ENT: No sore throat. Cardiovascular: Denies chest pain. Respiratory: Denies shortness of breath. Gastrointestinal: No abdominal pain.  No nausea, no vomiting.  No diarrhea.  No constipation. Genitourinary: Vaginal bleeding, mild, at [redacted] weeks gestation Musculoskeletal: Negative for back pain. Skin: Negative for rash. Neurological: Negative for headaches, focal weakness or numbness.  10-point ROS otherwise negative.  ____________________________________________   PHYSICAL EXAM:  VITAL SIGNS: ED Triage Vitals  Enc Vitals Group     BP 03/06/16 1847 130/66 mmHg     Pulse Rate 03/06/16 1847 93     Resp 03/06/16 1847 18     Temp 03/06/16 1847 98.3 F (36.8 C)     Temp Source 03/06/16 1847 Oral     SpO2 03/06/16 1847 100 %     Weight 03/06/16 1847 195 lb (88.451 kg)     Height 03/06/16 1847  (1.6 m)     Head Cir --      Peak Flow --      Pain Score 03/06/16 1848 0     Pain Loc --      Pain Edu? --      Excl. in GC? --     Constitutional: Alert and oriented. Well appearing and in no acute distress. Eyes: Conjunctivae are normal. PERRL. EOMI. Head: Atraumatic. Nose: No congestion/rhinnorhea. Mouth/Throat: Mucous membranes are moist.  Oropharynx non-erythematous. Neck: No stridor.  No meningeal signs.   Cardiovascular: Normal  rate, regular rhythm. Good peripheral circulation. Grossly normal heart sounds.   Respiratory: Normal respiratory effort.  No retractions. Lungs CTAB. Gastrointestinal: Soft and nontender. No distention.  Genitourinary: deferred to Dr. Vergie LivingPickens Musculoskeletal: No lower extremity tenderness nor edema. No gross deformities of extremities. Neurologic:  Normal speech and language. No gross focal neurologic deficits are appreciated.  Skin:  Skin is warm, dry and intact. No rash noted. Psych:  Anxious, somewhat tearful, but appropriate for the circumstances  ____________________________________________   LABS (all labs ordered are  listed, but only abnormal results are displayed)  Labs Reviewed  HCG, QUANTITATIVE, PREGNANCY - Abnormal; Notable for the following:    hCG, Beta Chain, Quant, S 9946 (*)    All other components within normal limits  URINALYSIS COMPLETEWITH MICROSCOPIC ([facility] ONLY) - Abnormal; Notable for the following:    Color, Urine YELLOW (*)    APPearance HAZY (*)    Bacteria, UA RARE (*)    Squamous Epithelial / LPF 6-30 (*)    All other components within normal limits  URINE CULTURE  ABO/RH   ____________________________________________  EKG  None ____________________________________________  RADIOLOGY   [place] Ob Limited (>[redacted] Weeks Gestation)  03/06/2016  CLINICAL DATA:  20 year old pregnant female with vaginal bleeding. EXAM: LIMITED OBSTETRIC ULTRASOUND FINDINGS: Number of Fetuses: 1 Heart Rate:  135 bpm Movement: Yes Presentation: Breech Placental Location: Posterior Previa: No Amniotic Fluid (Subjective):  Within normal limits. Femoral length:  2.9cm 19w  0d MATERNAL FINDINGS: Cervix:  Appears closed. Uterus/Adnexae:  No abnormality visualized. IMPRESSION: Single live intrauterine pregnancy with an estimated gestational age of [redacted] weeks, 0 days based on this ultrasound. No acute abnormality identified. This exam is performed on an emergent basis and does not comprehensively evaluate fetal size, dating, or anatomy; follow-up complete OB US should be considered if further fetal assessment is warranted. Electronically Signed   By: Elgie CollardArash  Radparvar M.D.   On: 03/06/2016 21:46    ____________________________________________   PROCEDURES  Procedure(s) performed: None  Critical Care performed: No ____________________________________________   INITIAL IMPRESSION / ASSESSMENT AND PLAN / ED COURSE  Pertinent labs & imaging results that were available during my care of the patient were reviewed by me and considered in my medical decision making (see chart for details).  Obtaining U/S  (transabdominal), patient in NAD at this time.  VSS.  Rh +, no indication for Rhogam.  ----------------------------------------- 10:25 PM on 03/06/2016 -----------------------------------------  Dr. Vergie LivingPickens evaluated the patient in person in the emergency Department and is pending a consult note.  I ordered a urine culture as per his request.  He will see the patient tomorrow in clinic for follow-up and further evaluation.  The patient understands and agrees with the plan. ____________________________________________  FINAL CLINICAL IMPRESSION(S) / ED DIAGNOSES  Final diagnoses:  Threatened abortion in second trimester      NEW MEDICATIONS STARTED DURING THIS VISIT:  New Prescriptions   No medications on file      Note:  This document was prepared using Dragon voice recognition software and may include unintentional dictation errors.   Loleta Roseory Osiris Odriscoll, MD 03/06/16 2225

## 2016-03-06 NOTE — Discharge Instructions (Signed)
You have been seen in the Emergency Department (ED) for vaginal bleeding during pregnancy, which is called a threatened abortion.  Fortunately, your evaluation was reassuring, and the ultrasound showed a normal pregnancy.  Please start taking prenatal vitamins (over the counter) if you are not already doing so.  As a result of your blood type, you did not receive an injection of medication called Rhogam - please let your OB/Gyn know.  Please follow up as recommended above.  If you develop any other symptoms that concern you (including, but not limited to, persistent vomiting, worsening bleeding, abdominal or pelvic pain, or fever greater than 101), please return immediately to the Emergency Department.   Threatened Miscarriage A threatened miscarriage occurs when you have vaginal bleeding during your first 20 weeks of pregnancy but the pregnancy has not ended. If you have vaginal bleeding during this time, your health care provider will do tests to make sure you are still pregnant. If the tests show you are still pregnant and the developing baby (fetus) inside your womb (uterus) is still growing, your condition is considered a threatened miscarriage. A threatened miscarriage does not mean your pregnancy will end, but it does increase the risk of losing your pregnancy (complete miscarriage). CAUSES  The cause of a threatened miscarriage is usually not known. If you go on to have a complete miscarriage, the most common cause is an abnormal number of chromosomes in the developing baby. Chromosomes are the structures inside cells that hold all your genetic material. Some causes of vaginal bleeding that do not result in miscarriage include:  Having sex.  Having an infection.  Normal hormone changes of pregnancy.  Bleeding that occurs when an egg implants in your uterus. RISK FACTORS Risk factors for bleeding in early pregnancy include:  Obesity.  Smoking.  Drinking excessive amounts of  alcohol or caffeine.  Recreational drug use. SIGNS AND SYMPTOMS  Light vaginal bleeding.  Mild abdominal pain or cramps. DIAGNOSIS  If you have bleeding with or without abdominal pain before 20 weeks of pregnancy, your health care provider will do tests to check whether you are still pregnant. One important test involves using sound waves and a computer (ultrasound) to create images of the inside of your uterus. Other tests include an internal exam of your vagina and uterus (pelvic exam) and measurement of your baby's heart rate.  You may be diagnosed with a threatened miscarriage if:  Ultrasound testing shows you are still pregnant.  Your baby's heart rate is strong.  A pelvic exam shows that the opening between your uterus and your vagina (cervix) is closed.  Your heart rate and blood pressure are stable.  Blood tests confirm you are still pregnant. TREATMENT  No treatments have been shown to prevent a threatened miscarriage from going on to a complete miscarriage. However, the right home care is important.  HOME CARE INSTRUCTIONS   Make sure you keep all your appointments for prenatal care. This is very important.  Get plenty of rest.  Do not have sex or use tampons if you have vaginal bleeding.  Do not douche.  Do not smoke or use recreational drugs.  Do not drink alcohol.  Avoid caffeine. SEEK MEDICAL CARE IF:  You have light vaginal bleeding or spotting while pregnant.  You have abdominal pain or cramping.  You have a fever. SEEK IMMEDIATE MEDICAL CARE IF:  You have heavy vaginal bleeding.  You have blood clots coming from your vagina.  You have severe low back  pain or abdominal cramps.  You have fever, chills, and severe abdominal pain. MAKE SURE YOU:  Understand these instructions.  Will watch your condition.  Will get help right away if you are not doing well or get worse.   This information is not intended to replace advice given to you by your  health care provider. Make sure you discuss any questions you have with your health care provider.   Document Released: 12/02/2005 Document Revised: 12/07/2013 Document Reviewed: 09/28/2013 Elsevier Interactive Patient Education 2016 Elsevier Inc.   Pelvic Rest Pelvic rest is sometimes recommended for women when:   The placenta is partially or completely covering the opening of the cervix (placenta previa).  There is bleeding between the uterine wall and the amniotic sac in the first trimester (subchorionic hemorrhage).  The cervix begins to open without labor starting (incompetent cervix, cervical insufficiency).  The labor is too early (preterm labor). HOME CARE INSTRUCTIONS  Do not have sexual intercourse, stimulation, or an orgasm.  Do not use tampons, douche, or put anything in the vagina.  Do not lift anything over 10 pounds (4.5 kg).  Avoid strenuous activity or straining your pelvic muscles. SEEK MEDICAL CARE IF:  You have any vaginal bleeding during pregnancy. Treat this as a potential emergency.  You have cramping pain felt low in the stomach (stronger than menstrual cramps).  You notice vaginal discharge (watery, mucus, or bloody).  You have a low, dull backache.  There are regular contractions or uterine tightening. SEEK IMMEDIATE MEDICAL CARE IF: You have vaginal bleeding and have placenta previa.    This information is not intended to replace advice given to you by your health care provider. Make sure you discuss any questions you have with your health care provider.   Document Released: 03/29/2011 Document Revised: 02/24/2012 Document Reviewed: 06/05/2015 Elsevier Interactive Patient Education [employer]2016 Elsevier Inc.

## 2016-03-06 NOTE — ED Notes (Signed)
Patient to ED via POV from home with husband for complaint of lower abdominal cramping, and light red bleeding on toilet tissue when she urinated. States it was steady but not copious and currently still feels pressure in lower part of her stomach. Denies N/V/D and states she is [redacted] weeks pregnant. [facility] and [facility] there on March 2. No complications with this pregnancy, G1, P0. Patient is worried something is wrong with the baby and is tearful with family members at bedside.

## 2016-03-08 LAB — URINE CULTURE: SPECIAL REQUESTS: NORMAL

## 2016-06-18 ENCOUNTER — Observation Stay
Admission: EM | Admit: 2016-06-18 | Discharge: 2016-06-18 | Disposition: A | Discharge status: Home / Self Care | Payer: Medicaid Other | Attending: Obstetrics & Gynecology | Admitting: Obstetrics & Gynecology

## 2016-06-18 DIAGNOSIS — Z3A34 34 weeks gestation of pregnancy: Secondary | ICD-10-CM | POA: Insufficient documentation

## 2016-06-18 DIAGNOSIS — O9989 Other specified diseases and conditions complicating pregnancy, childbirth and the puerperium: Secondary | ICD-10-CM | POA: Diagnosis present

## 2016-06-18 DIAGNOSIS — R32 Unspecified urinary incontinence: Secondary | ICD-10-CM | POA: Diagnosis not present

## 2016-06-18 HISTORY — DX: Depression, unspecified: F32.A

## 2016-06-18 HISTORY — DX: Major depressive disorder, single episode, unspecified: F32.9

## 2016-06-18 MED ORDER — ACETAMINOPHEN 325 MG PO TABS: 650.0000 mg | ORAL_TABLET | ORAL | Status: DC | PRN

## 2016-06-18 MED ORDER — ONDANSETRON HCL 4 MG/2ML IJ SOLN: 4.0000 mg | Freq: Four times a day (QID) | INTRAMUSCULAR | Status: DC | PRN

## 2016-06-18 NOTE — Discharge Instructions (Signed)
Drink plenty of fluid, get plenty of rest.  °

## 2016-06-18 NOTE — Final Progress Note (Signed)
Physician Final Progress Note  Patient ID: Brooke Bauer MRN: [id number] DOB/AGE: 20/06/1996 20 y.o.  Admit date: 06/18/2016 Admitting provider: Nadara Mustardobert P Prinston Kynard, MD Discharge date: 06/18/2016   Admission Diagnoses: Leaking fluid  Discharge Diagnoses:  Active Problems:   Urinary incontinence  Consults: None  Significant Findings/ Diagnostic Studies: 20 yo G1 at 2634 2/7 weeks with LOF several times since 1330 today, no pain or ctxs or VB. Good FM. [facility], no complications. AF, VSS.  Gen NAD.  Abd gravid, NT, FHTs 140s.  SSE no pooling or Nitrazine.  FERN NEG.  SVE Cl/TH/HI.  Extr no edema. Plan- No PPROM, likely urinary incontinence due to pregnancy.  Procedures: A NST procedure was performed with FHR monitoring and a normal baseline established, appropriate time of 20-40 minutes of evaluation, and accels >2 seen w 15x15 characteristics.  Results show a REACTIVE NST.   Discharge Condition: good  Disposition: 01-Home or Self Care  Diet: Regular diet  Discharge Activity: Activity as tolerated     Medication List    TAKE these medications        Ferrous Sulfate 134 MG Tabs  Take by mouth daily.     magic mouthwash w/lidocaine Soln  Take 5 mLs by mouth 4 (four) times daily. Swish, gargle, spit.     multivitamin-prenatal 27-0.8 MG Tabs tablet  Take 1 tablet by mouth daily at 12 noon.     ranitidine 75 MG tablet  Commonly known as:  ZANTAC  Take 1 tablet (75 mg total) by mouth 2 (two) times daily.           Follow-up Information    Go to Letitia Libraobert Paul Maidie Streight, MD.   Specialty:  Obstetrics and Gynecology   Why:  As Scheduled   Contact information:   [address] [place] [address] [phone]       Total time spent taking care of this patient: 15 minutes  Signed: Letitia Libraobert Paul Tremont Gavitt 06/18/2016, 8:14 PM

## 2016-06-18 NOTE — Discharge Summary (Signed)
  See FPN 

## 2016-07-26 ENCOUNTER — Encounter: Payer: Self-pay | Admitting: *Deleted

## 2016-07-26 ENCOUNTER — Observation Stay
Admission: EM | Admit: 2016-07-26 | Discharge: 2016-07-26 | Disposition: A | Discharge status: Home / Self Care | Payer: Medicaid Other | Attending: Obstetrics and Gynecology | Admitting: Obstetrics and Gynecology

## 2016-07-26 DIAGNOSIS — Z3A00 Weeks of gestation of pregnancy not specified: Secondary | ICD-10-CM | POA: Diagnosis not present

## 2016-07-26 DIAGNOSIS — O139 Gestational [pregnancy-induced] hypertension without significant proteinuria, unspecified trimester: Principal | ICD-10-CM | POA: Insufficient documentation

## 2016-07-26 DIAGNOSIS — O368190 Decreased fetal movements, unspecified trimester, not applicable or unspecified: Secondary | ICD-10-CM | POA: Insufficient documentation

## 2016-07-26 LAB — COMPREHENSIVE METABOLIC PANEL
ALK PHOS: 147 U/L — AB (ref 38–126)
ALT: 8 U/L — AB (ref 14–54)
AST: 16 U/L (ref 15–41)
Albumin: 3 g/dL — ABNORMAL LOW (ref 3.5–5.0)
Anion gap: 8 (ref 5–15)
BUN: 10 mg/dL (ref 6–20)
CALCIUM: 9.1 mg/dL (ref 8.9–10.3)
CHLORIDE: 109 mmol/L (ref 101–111)
CO2: 20 mmol/L — ABNORMAL LOW (ref 22–32)
CREATININE: 0.42 mg/dL — AB (ref 0.44–1.00)
Glucose, Bld: 106 mg/dL — ABNORMAL HIGH (ref 65–99)
Potassium: 3.7 mmol/L (ref 3.5–5.1)
Sodium: 137 mmol/L (ref 135–145)
Total Bilirubin: 0.3 mg/dL (ref 0.3–1.2)
Total Protein: 6.2 g/dL — ABNORMAL LOW (ref 6.5–8.1)

## 2016-07-26 LAB — CBC
HEMATOCRIT: 38.1 % (ref 35.0–47.0)
HEMOGLOBIN: 13.3 g/dL (ref 12.0–16.0)
MCH: 30.7 pg (ref 26.0–34.0)
MCHC: 34.8 g/dL (ref 32.0–36.0)
MCV: 88.4 fL (ref 80.0–100.0)
PLATELETS: 173 10*3/uL (ref 150–440)
RBC: 4.32 MIL/uL (ref 3.80–5.20)
RDW: 13.1 % (ref 11.5–14.5)
WBC: 14.7 10*3/uL — AB (ref 3.6–11.0)

## 2016-07-26 LAB — PROTEIN / CREATININE RATIO, URINE
CREATININE, URINE: 269 mg/dL
Protein Creatinine Ratio: 0.14 mg/mg{Cre} (ref 0.00–0.15)
TOTAL PROTEIN, URINE: 38 mg/dL

## 2016-07-26 NOTE — Final Progress Note (Signed)
Physician Final Progress Note  Patient ID: Brooke Bauer MRN: [id number] DOB/AGE: June 02, 1996 20 y.o.  Admit date: 07/26/2016 Admitting provider: Vena Austria, MD Discharge date: 07/26/2016   Admission Diagnoses: Elevated BP and decreased fetal movement  Discharge Diagnoses:  Active Problems:   Indication for care in labor or delivery   Consults: None  Significant Findings/ Diagnostic Studies: labs:  Results for orders placed or performed during the hospital encounter of 07/26/16 (from the past 24 hour(s))  CBC     Status: Abnormal   Collection Time: 07/26/16  4:16 PM  Result Value Ref Range   WBC 14.7 (H) 3.6 - 11.0 K/uL   RBC 4.32 3.80 - 5.20 MIL/uL   Hemoglobin 13.3 12.0 - 16.0 g/dL   HCT 13.0 86.5 - 78.4 %   MCV 88.4 80.0 - 100.0 fL   MCH 30.7 26.0 - 34.0 pg   MCHC 34.8 32.0 - 36.0 g/dL   RDW 69.6 29.5 - 28.4 %   Platelets 173 150 - 440 K/uL  Comprehensive metabolic panel     Status: Abnormal   Collection Time: 07/26/16  4:16 PM  Result Value Ref Range   Sodium 137 135 - 145 mmol/L   Potassium 3.7 3.5 - 5.1 mmol/L   Chloride 109 101 - 111 mmol/L   CO2 20 (L) 22 - 32 mmol/L   Glucose, Bld 106 (H) 65 - 99 mg/dL   BUN 10 6 - 20 mg/dL   Creatinine, Ser 1.32 (L) 0.44 - 1.00 mg/dL   Calcium 9.1 8.9 - 44.0 mg/dL   Total Protein 6.2 (L) 6.5 - 8.1 g/dL   Albumin 3.0 (L) 3.5 - 5.0 g/dL   AST 16 15 - 41 U/L   ALT 8 (L) 14 - 54 U/L   Alkaline Phosphatase 147 (H) 38 - 126 U/L   Total Bilirubin 0.3 0.3 - 1.2 mg/dL   GFR calc non Af Amer >60 >60 mL/min   GFR calc Af Amer >60 >60 mL/min   Anion gap 8 5 - 15  Protein / creatinine ratio, urine     Status: None   Collection Time: 07/26/16  4:53 PM  Result Value Ref Range   Creatinine, Urine 269 mg/dL   Total Protein, Urine 38 mg/dL   Protein Creatinine Ratio 0.14 0.00 - 0.15 mg/mg[Cre]     Procedures: NST 130, moderate variability, +accles, no decels  Discharge Condition: good  Disposition: 01-Home or Self  Care  Diet: Regular diet  Discharge Activity: Activity as tolerated  Discharge Instructions    Discharge activity:  No Restrictions    Complete by:  As directed   Fetal Kick Count:  Lie on our left side for one hour after a meal, and count the number of times your baby kicks.  If it is less than 5 times, get up, move around and drink some juice.  Repeat the test 30 minutes later.  If it is still less than 5 kicks in an hour, notify your doctor.    Complete by:  As directed   LABOR:  When conractions begin, you should start to time them from the beginning of one contraction to the beginning  of the next.  When contractions are 5 - 10 minutes apart or less and have been regular for at least an hour, you should call your health care provider.    Complete by:  As directed   No sexual activity restrictions    Complete by:  As directed   Notify physician for  bleeding from the vagina    Complete by:  As directed   Notify physician for blurring of vision or spots before the eyes    Complete by:  As directed   Notify physician for chills or fever    Complete by:  As directed   Notify physician for fainting spells, "black outs" or loss of consciousness    Complete by:  As directed   Notify physician for increase in vaginal discharge    Complete by:  As directed   Notify physician for leaking of fluid    Complete by:  As directed   Notify physician for pain or burning when urinating    Complete by:  As directed   Notify physician for pelvic pressure (sudden increase)    Complete by:  As directed   Notify physician for severe or continued nausea or vomiting    Complete by:  As directed   Notify physician for sudden gushing of fluid from the vagina (with or without continued leaking)    Complete by:  As directed   Notify physician for sudden, constant, or occasional abdominal pain    Complete by:  As directed   Notify physician if baby moving less than usual    Complete by:  As directed       Medication  List    TAKE these medications   Ferrous Sulfate 134 MG Tabs Take by mouth daily.   magic mouthwash w/lidocaine Soln Take 5 mLs by mouth 4 (four) times daily. Swish, gargle, spit.   multivitamin-prenatal 27-0.8 MG Tabs tablet Take 1 tablet by mouth daily at 12 noon.   ranitidine 75 MG tablet Commonly known as:  ZANTAC Take 1 tablet (75 mg total) by mouth 2 (two) times daily.        Total time spent taking care of this patient: 10 minutes  Signed: Lorrene ReidSTAEBLER, Luisa Louk M 07/26/2016, 6:04 PM

## 2016-07-26 NOTE — OB Triage Note (Signed)
Recvd with c/o decreased fetal movement.  Changed to gown and to bed.  EFM applied.  Oriented to room and plan of care discussed.

## 2016-07-26 NOTE — Progress Notes (Signed)
Discharge instructions given and explained.  Verbalized understanding.  Questions answered.  Signed copy on chart and one in hand.

## 2016-07-28 ENCOUNTER — Encounter: Payer: Self-pay | Admitting: *Deleted

## 2016-07-28 ENCOUNTER — Inpatient Hospital Stay
Admission: EM | Admit: 2016-07-28 | Discharge: 2016-07-31 | DRG: 775 | Disposition: A | Discharge status: Home / Self Care | Payer: Medicaid Other | Attending: Advanced Practice Midwife | Admitting: Advanced Practice Midwife

## 2016-07-28 DIAGNOSIS — Z8249 Family history of ischemic heart disease and other diseases of the circulatory system: Secondary | ICD-10-CM

## 2016-07-28 DIAGNOSIS — O99334 Smoking (tobacco) complicating childbirth: Principal | ICD-10-CM | POA: Diagnosis present

## 2016-07-28 DIAGNOSIS — F17210 Nicotine dependence, cigarettes, uncomplicated: Secondary | ICD-10-CM | POA: Diagnosis present

## 2016-07-28 DIAGNOSIS — Z9049 Acquired absence of other specified parts of digestive tract: Secondary | ICD-10-CM

## 2016-07-28 DIAGNOSIS — Z3A40 40 weeks gestation of pregnancy: Secondary | ICD-10-CM

## 2016-07-28 DIAGNOSIS — Z79899 Other long term (current) drug therapy: Secondary | ICD-10-CM

## 2016-07-28 DIAGNOSIS — Z6836 Body mass index (BMI) 36.0-36.9, adult: Secondary | ICD-10-CM

## 2016-07-28 DIAGNOSIS — O99214 Obesity complicating childbirth: Secondary | ICD-10-CM | POA: Diagnosis present

## 2016-07-28 NOTE — OB Triage Note (Signed)
G1 Presents at 3159w0d w/c/o ctx 1-2 minutes apart that started at 2142. No gush of fluid or vaginal bleeding. Positive FM.

## 2016-07-29 ENCOUNTER — Encounter: Payer: Self-pay | Admitting: Anesthesiology

## 2016-07-29 ENCOUNTER — Inpatient Hospital Stay: Payer: Medicaid Other | Admitting: Anesthesiology

## 2016-07-29 DIAGNOSIS — O99334 Smoking (tobacco) complicating childbirth: Secondary | ICD-10-CM | POA: Diagnosis present

## 2016-07-29 DIAGNOSIS — F17210 Nicotine dependence, cigarettes, uncomplicated: Secondary | ICD-10-CM | POA: Diagnosis present

## 2016-07-29 DIAGNOSIS — Z9049 Acquired absence of other specified parts of digestive tract: Secondary | ICD-10-CM | POA: Diagnosis not present

## 2016-07-29 DIAGNOSIS — Z6836 Body mass index (BMI) 36.0-36.9, adult: Secondary | ICD-10-CM | POA: Diagnosis not present

## 2016-07-29 DIAGNOSIS — O99214 Obesity complicating childbirth: Secondary | ICD-10-CM | POA: Diagnosis present

## 2016-07-29 DIAGNOSIS — Z3A40 40 weeks gestation of pregnancy: Secondary | ICD-10-CM | POA: Diagnosis not present

## 2016-07-29 DIAGNOSIS — Z8249 Family history of ischemic heart disease and other diseases of the circulatory system: Secondary | ICD-10-CM | POA: Diagnosis not present

## 2016-07-29 DIAGNOSIS — Z79899 Other long term (current) drug therapy: Secondary | ICD-10-CM | POA: Diagnosis not present

## 2016-07-29 LAB — COMPREHENSIVE METABOLIC PANEL
ALBUMIN: 3 g/dL — AB (ref 3.5–5.0)
ALK PHOS: 147 U/L — AB (ref 38–126)
ALT: 8 U/L — AB (ref 14–54)
ANION GAP: 8 (ref 5–15)
AST: 20 U/L (ref 15–41)
BUN: 10 mg/dL (ref 6–20)
CALCIUM: 9.6 mg/dL (ref 8.9–10.3)
CO2: 20 mmol/L — AB (ref 22–32)
Chloride: 108 mmol/L (ref 101–111)
Creatinine, Ser: 0.53 mg/dL (ref 0.44–1.00)
GFR calc Af Amer: >60 mL/min (ref >60)
GFR calc non Af Amer: >60 mL/min (ref >60)
GLUCOSE: 92 mg/dL (ref 65–99)
Potassium: 3.7 mmol/L (ref 3.5–5.1)
SODIUM: 136 mmol/L (ref 135–145)
Total Bilirubin: 0.5 mg/dL (ref 0.3–1.2)
Total Protein: 6.6 g/dL (ref 6.5–8.1)

## 2016-07-29 LAB — CBC
HCT: 37.1 % (ref 35.0–47.0)
Hemoglobin: 13.1 g/dL (ref 12.0–16.0)
MCH: 31.1 pg (ref 26.0–34.0)
MCHC: 35.5 g/dL (ref 32.0–36.0)
MCV: 87.7 fL (ref 80.0–100.0)
PLATELETS: 156 10*3/uL (ref 150–440)
RBC: 4.23 MIL/uL (ref 3.80–5.20)
RDW: 13 % (ref 11.5–14.5)
WBC: 17.7 10*3/uL — ABNORMAL HIGH (ref 3.6–11.0)

## 2016-07-29 LAB — TYPE AND SCREEN
ABO/RH(D): A POS
Antibody Screen: NEGATIVE

## 2016-07-29 LAB — PROTEIN / CREATININE RATIO, URINE
CREATININE, URINE: 177 mg/dL
Protein Creatinine Ratio: 0.14 mg/mg{Cre} (ref 0.00–0.15)
Total Protein, Urine: 25 mg/dL

## 2016-07-29 MED ORDER — COCONUT OIL OIL: 1.0000 "application " | TOPICAL_OIL | Status: DC | PRN

## 2016-07-29 MED ORDER — NALBUPHINE HCL 10 MG/ML IJ SOLN: 5.0000 mg | Freq: Once | INTRAMUSCULAR | Status: DC | PRN

## 2016-07-29 MED ORDER — DIPHENHYDRAMINE HCL 25 MG PO CAPS: 25.0000 mg | ORAL_CAPSULE | Freq: Four times a day (QID) | ORAL | Status: DC | PRN

## 2016-07-29 MED ORDER — LACTATED RINGERS IV SOLN: INTRAVENOUS | Status: DC

## 2016-07-29 MED ORDER — BUPIVACAINE HCL (PF) 0.25 % IJ SOLN
INTRAMUSCULAR | Status: DC | PRN
  Administered 2016-07-29: 4 mL via EPIDURAL

## 2016-07-29 MED ORDER — WITCH HAZEL-GLYCERIN EX PADS: 1.0000 "application " | MEDICATED_PAD | CUTANEOUS | Status: DC | PRN

## 2016-07-29 MED ORDER — OXYCODONE-ACETAMINOPHEN 5-325 MG PO TABS
1.0000 | ORAL_TABLET | ORAL | Status: DC | PRN
  Administered 2016-07-30: 1 via ORAL
  Filled 2016-07-29: qty 1

## 2016-07-29 MED ORDER — LIDOCAINE HCL (PF) 1 % IJ SOLN
INTRAMUSCULAR | Status: AC
  Filled 2016-07-29: qty 30

## 2016-07-29 MED ORDER — KETOROLAC TROMETHAMINE 30 MG/ML IJ SOLN: 30.0000 mg | Freq: Four times a day (QID) | INTRAMUSCULAR | Status: DC | PRN

## 2016-07-29 MED ORDER — SCOPOLAMINE 1 MG/3DAYS TD PT72: 1.0000 | MEDICATED_PATCH | Freq: Once | TRANSDERMAL | Status: DC

## 2016-07-29 MED ORDER — FERROUS SULFATE 325 (65 FE) MG PO TABS
325.0000 mg | ORAL_TABLET | Freq: Every day | ORAL | Status: DC
  Administered 2016-07-30 – 2016-07-31 (×2): 325 mg via ORAL
  Filled 2016-07-29 (×2): qty 1

## 2016-07-29 MED ORDER — FENTANYL 2.5 MCG/ML W/ROPIVACAINE 0.2% IN NS 100 ML EPIDURAL INFUSION (ARMC-ANES)
10.0000 mL/h | EPIDURAL | Status: DC
Start: 2016-07-29 — End: 2016-07-29

## 2016-07-29 MED ORDER — ACETAMINOPHEN 325 MG PO TABS: 650.0000 mg | ORAL_TABLET | ORAL | Status: DC | PRN

## 2016-07-29 MED ORDER — DOCUSATE SODIUM 100 MG PO CAPS: 100.0000 mg | ORAL_CAPSULE | Freq: Two times a day (BID) | ORAL | Status: DC | PRN

## 2016-07-29 MED ORDER — OXYTOCIN 40 UNITS IN LACTATED RINGERS INFUSION - SIMPLE MED
2.5000 [IU]/h | INTRAVENOUS | Status: DC
  Filled 2016-07-29: qty 1000

## 2016-07-29 MED ORDER — OXYTOCIN 10 UNIT/ML IJ SOLN
INTRAMUSCULAR | Status: AC
  Filled 2016-07-29: qty 2

## 2016-07-29 MED ORDER — NALOXONE HCL 0.4 MG/ML IJ SOLN
0.4000 mg | INTRAMUSCULAR | Status: DC | PRN
Start: 2016-07-29 — End: 2016-07-29

## 2016-07-29 MED ORDER — ONDANSETRON HCL 4 MG PO TABS: 4.0000 mg | ORAL_TABLET | ORAL | Status: DC | PRN

## 2016-07-29 MED ORDER — NALOXONE HCL 2 MG/2ML IJ SOSY
1.0000 ug/kg/h | PREFILLED_SYRINGE | INTRAVENOUS | Status: DC | PRN
  Filled 2016-07-29: qty 2

## 2016-07-29 MED ORDER — OXYCODONE-ACETAMINOPHEN 5-325 MG PO TABS: 2.0000 | ORAL_TABLET | ORAL | Status: DC | PRN

## 2016-07-29 MED ORDER — DIPHENHYDRAMINE HCL 25 MG PO CAPS: 25.0000 mg | ORAL_CAPSULE | ORAL | Status: DC | PRN

## 2016-07-29 MED ORDER — OXYTOCIN BOLUS FROM INFUSION: 500.0000 mL | Freq: Once | INTRAVENOUS | Status: DC

## 2016-07-29 MED ORDER — ZOLPIDEM TARTRATE 5 MG PO TABS: 5.0000 mg | ORAL_TABLET | Freq: Every evening | ORAL | Status: DC | PRN

## 2016-07-29 MED ORDER — LACTATED RINGERS IV SOLN
INTRAVENOUS | Status: DC
  Administered 2016-07-29: 1000 mL via INTRAVENOUS
  Administered 2016-07-29: 16:00:00 via INTRAVENOUS

## 2016-07-29 MED ORDER — BUTORPHANOL TARTRATE 1 MG/ML IJ SOLN
1.0000 mg | INTRAMUSCULAR | Status: DC | PRN
  Administered 2016-07-29: 1 mg via INTRAVENOUS
  Filled 2016-07-29: qty 1

## 2016-07-29 MED ORDER — SODIUM CHLORIDE 0.9 % IJ SOLN
INTRAMUSCULAR | Status: AC
Start: 2016-07-29 — End: 2016-07-29
  Filled 2016-07-29: qty 50

## 2016-07-29 MED ORDER — NICOTINE 14 MG/24HR TD PT24
14.0000 mg | MEDICATED_PATCH | Freq: Every day | TRANSDERMAL | Status: DC
  Filled 2016-07-29: qty 1

## 2016-07-29 MED ORDER — ONDANSETRON HCL 4 MG/2ML IJ SOLN: 4.0000 mg | Freq: Three times a day (TID) | INTRAMUSCULAR | Status: DC | PRN

## 2016-07-29 MED ORDER — ONDANSETRON HCL 4 MG/2ML IJ SOLN: 4.0000 mg | Freq: Four times a day (QID) | INTRAMUSCULAR | Status: DC | PRN

## 2016-07-29 MED ORDER — DIPHENHYDRAMINE HCL 50 MG/ML IJ SOLN: 12.5000 mg | INTRAMUSCULAR | Status: DC | PRN

## 2016-07-29 MED ORDER — FENTANYL 2.5 MCG/ML W/ROPIVACAINE 0.2% IN NS 100 ML EPIDURAL INFUSION (ARMC-ANES)
EPIDURAL | Status: DC | PRN
  Administered 2016-07-29: 9 mL/h via EPIDURAL

## 2016-07-29 MED ORDER — TERBUTALINE SULFATE 1 MG/ML IJ SOLN: 0.2500 mg | Freq: Once | INTRAMUSCULAR | Status: DC | PRN

## 2016-07-29 MED ORDER — DIBUCAINE 1 % RE OINT
1.0000 | TOPICAL_OINTMENT | RECTAL | Status: DC | PRN
Start: 2016-07-29 — End: 2016-07-31

## 2016-07-29 MED ORDER — SODIUM CHLORIDE 0.9% FLUSH: 3.0000 mL | INTRAVENOUS | Status: DC | PRN

## 2016-07-29 MED ORDER — MEASLES, MUMPS & RUBELLA VAC ~~LOC~~ INJ
0.5000 mL | INJECTION | Freq: Once | SUBCUTANEOUS | Status: AC
  Administered 2016-07-31: 0.5 mL via SUBCUTANEOUS
  Filled 2016-07-29: qty 0.5

## 2016-07-29 MED ORDER — LIDOCAINE HCL (PF) 1 % IJ SOLN: 30.0000 mL | INTRAMUSCULAR | Status: DC | PRN

## 2016-07-29 MED ORDER — MISOPROSTOL 200 MCG PO TABS
ORAL_TABLET | ORAL | Status: AC
  Filled 2016-07-29: qty 4

## 2016-07-29 MED ORDER — SIMETHICONE 80 MG PO CHEW: 80.0000 mg | CHEWABLE_TABLET | ORAL | Status: DC | PRN

## 2016-07-29 MED ORDER — IBUPROFEN 600 MG PO TABS
600.0000 mg | ORAL_TABLET | Freq: Four times a day (QID) | ORAL | Status: DC
  Filled 2016-07-29: qty 1

## 2016-07-29 MED ORDER — OXYTOCIN 40 UNITS IN LACTATED RINGERS INFUSION - SIMPLE MED
1.0000 m[IU]/min | INTRAVENOUS | Status: DC
  Administered 2016-07-29: 1 m[IU]/min via INTRAVENOUS

## 2016-07-29 MED ORDER — NALBUPHINE HCL 10 MG/ML IJ SOLN: 5.0000 mg | INTRAMUSCULAR | Status: DC | PRN

## 2016-07-29 MED ORDER — VARICELLA VIRUS VACCINE LIVE 1350 PFU/0.5ML IJ SUSR: 0.5000 mL | INTRAMUSCULAR | Status: DC | PRN

## 2016-07-29 MED ORDER — FENTANYL 2.5 MCG/ML W/ROPIVACAINE 0.2% IN NS 100 ML EPIDURAL INFUSION (ARMC-ANES)
EPIDURAL | Status: AC
  Filled 2016-07-29: qty 100

## 2016-07-29 MED ORDER — MEPERIDINE HCL 25 MG/ML IJ SOLN: 6.2500 mg | INTRAMUSCULAR | Status: DC | PRN

## 2016-07-29 MED ORDER — LIDOCAINE HCL (PF) 1 % IJ SOLN
INTRAMUSCULAR | Status: DC | PRN
  Administered 2016-07-29: 3 mL

## 2016-07-29 MED ORDER — AMMONIA AROMATIC IN INHA
RESPIRATORY_TRACT | Status: AC
  Filled 2016-07-29: qty 10

## 2016-07-29 MED ORDER — LIDOCAINE-EPINEPHRINE (PF) 1.5 %-1:200000 IJ SOLN
INTRAMUSCULAR | Status: DC | PRN
  Administered 2016-07-29: 3 mL via PERINEURAL

## 2016-07-29 MED ORDER — PRENATAL MULTIVITAMIN CH: 1.0000 | ORAL_TABLET | Freq: Every day | ORAL | Status: DC

## 2016-07-29 MED ORDER — LACTATED RINGERS IV SOLN: 500.0000 mL | INTRAVENOUS | Status: DC | PRN

## 2016-07-29 MED ORDER — BENZOCAINE-MENTHOL 20-0.5 % EX AERO: 1.0000 "application " | INHALATION_SPRAY | CUTANEOUS | Status: DC | PRN

## 2016-07-29 MED ORDER — ONDANSETRON HCL 4 MG/2ML IJ SOLN: 4.0000 mg | INTRAMUSCULAR | Status: DC | PRN

## 2016-07-29 NOTE — Discharge Summary (Signed)
Obstetric Discharge Summary Reason for Admission: onset of labor Delivery Type: spontaneous vaginal delivery Postpartum Procedures: none Complications-Intrapartum or Postpartum: none    Recent Labs  07/29/16 0011 07/30/16 0511  HGB 13.1 12.1  HCT 37.1 34.5*      Gestational Age at Delivery: [redacted]w[redacted]d Antepartum complications: none Date of Delivery: 07/31/16  Delivered By: TDarrin Luis  Physical Exam:  General: alert and cooperative Lochia: appropriate Uterine Fundus: firm Incision: N/A DVT Evaluation: No evidence of DVT seen on physical exam. Abdomen: abdomen is soft without significant tenderness, masses, organomegaly or guarding  Prenatal Labs Blood Type: A+ Rubella: Non-immune - MMR given Varicella: Non-immune - vaccine given TDAP: Up to date and Given during pregnancy Feeding: Breast Contraception: OCP  Discharge Diagnoses: Term Pregnancy-delivered  Discharge Information: Date: 07/31/2016 Activity: unrestricted Diet: routine Medications: PNV and Ibuprofen Condition: stable Discharge instructions:   Call office if you have any of the following: headache, visual changes, fever >100 F, chills, breast concerns, excessive vaginal bleeding, incision drainage or problems, leg pain or redness, depression or any other concerns.   Activity: Do not lift > 10 lbs for 6 weeks.  No intercourse or tampons for 6 weeks.  No driving for 1-2 weeks.   Discharge to: home Follow-up Information    [facility]ene [facility] [place] Schedule an appointment as soon as possible for a visit in 6 week(s).   Specialty:  Certified Nurse Midwife Contact information: [place] [phone]          Newborn Data: Live born female  Birth Weight: 6 lb 3.8 oz (2830 g) APGAR: 8, 9  Home with mother.  RHoyt Koch MD 07/31/2016, 10:19 AM

## 2016-07-29 NOTE — Progress Notes (Signed)
S: Pt resting in bed, reports still feeling painful ctx  O: Cervix 2/90/-1, FHR baseline 140, mod variability, + accels. Toco: q 2-5m42in, VSS  FHR tracing reviewed from overnight. Prolonged FHR decel around 0445 with intermittent variables noted  A: IUp at 1745w1d, early labor, prior cat 2 tracing   P: labor augmentation for category 2 tracing overnight. Pt in agreement with plan. Will wait until after pt has had a chance to eat.

## 2016-07-29 NOTE — Progress Notes (Signed)
S: Pt comfortable with epidural, reporting SROM just prior to exam   O: Cervix 5/100/-1, VSS, cat 1 tracing, toco not graphing well  A: IUP at 6319w1d, induction for cat 2 tracing  P: IUPC placed, titrate pitocin for adequate MVUs Follow for cervical dilation

## 2016-07-29 NOTE — H&P (Signed)
OB History & Physical   History of Present Illness:  Chief Complaint: Contractions that began earlier in the evening  HPI:  Brooke Bauer is a 20 y.o. G1P0 female at 6429w1d dated by U/S.  Her pregnancy has been complicated by Obesity BMI 36, smoking.    She reports contractions.   She reports leakage of fluid.   She denies vaginal bleeding.   She reports fetal movement.    Maternal Medical History:   Past Medical History:  Diagnosis Date  . Depression    no meds x 5 years    Past Surgical History:  Procedure Laterality Date  . APPENDECTOMY      No Known Allergies  Prior to Admission medications   Medication Sig Start Date End Date Taking? Authorizing Provider  Ferrous Sulfate 134 MG TABS Take by mouth daily.    Historical Provider, MD  magic mouthwash w/lidocaine SOLN Take 5 mLs by mouth 4 (four) times daily. Swish, gargle, spit. Patient not taking: Reported on 06/18/2016 12/25/15   Delorise RoyalsJonathan D Cuthriell, PA-C  Prenatal Vit-Fe Fumarate-FA (MULTIVITAMIN-PRENATAL) 27-0.8 MG TABS tablet Take 1 tablet by mouth daily at 12 noon.    Historical Provider, MD  ranitidine (ZANTAC) 75 MG tablet Take 1 tablet (75 mg total) by mouth 2 (two) times daily. Patient not taking: Reported on 06/18/2016 12/05/15   Myrna Blazeravid Matthew Schaevitz, MD    OB History  Gravida Para Term Preterm AB Living  1            SAB TAB Ectopic Multiple Live Births               # Outcome Date GA Lbr Len/2nd Weight Sex Delivery Anes PTL Lv  1 Current               Prenatal care site: [facility]  Social History: She  reports that she has been smoking Cigarettes.  She has been smoking about 0.25 packs per day. She has never used smokeless tobacco. She reports that she does not drink alcohol or use drugs.  Family History: family history includes Hypertension in her father, maternal grandfather, and mother.   Review of Systems: Negative x 10 systems reviewed except as noted in the HPI.    Physical Exam:  Vital  Signs: BP (!) 156/88   Pulse 85   Temp 98.2 F (36.8 C) (Oral)   Resp 20   Ht 5\' 3"  (1.6 m)   Wt 100.7 kg (222 lb)   LMP 09/30/2015   BMI 39.33 kg/m  General: no acute distress.  HEENT: normocephalic, atraumatic Heart: regular rate & rhythm.  No murmurs/rubs/gallops Lungs: clear to auscultation bilaterally Abdomen: soft, gravid, non-tender;  EFW: 7.5 pounds Pelvic:   External: Normal external female genitalia  Cervix: Dilation: 3.5 / Effacement (%): 90 / Station: 0              Nitrazine negative Extremities: non-tender, symmetric, trace edema bilaterally.  DTRs: 2+ Neurologic: Alert & oriented x 3.    Pertinent Results:  Prenatal Labs: Blood type/Rh A positive  Antibody screen negative  Rubella Non Immune  Varicella Non Immune    RPR Non Reactive  HBsAg negative  HIV negative  GC negative  Chlamydia negative  Genetic screening Informaseq negative, AFP negative  1 hour GTT 120 on 5/18  3 hour GTT NA  GBS negative on 7/20   Baseline FHR: 155 beats/min   Variability: moderate   Accelerations: present   Decelerations: present- variables Contractions: present  frequency: 1-5 min Overall assessment: Category II presently    Assessment:  Brooke Bauer is a 20 y.o. G1P0 female at 5118w1d with labor contractions.   Plan:  1. Admit to Labor & Delivery  2. CBC, T&S, Clrs, IVF 3. GBS negative.   4. Fetal well-being: Category II 5. Expectant management 6. May have epidural as requested   Jodeci Roarty, CNM

## 2016-07-29 NOTE — Progress Notes (Signed)
Foley bulb placed in cervix and balloon inflated with 30 cc NS. Traction applied to leg. Ordered Pitocin 1x1 to begin now also.

## 2016-07-29 NOTE — Anesthesia Procedure Notes (Signed)
Epidural Patient location during procedure: OB Start time: 07/29/2016 4:33 PM End time: 07/29/2016 4:59 PM  Staffing Anesthesiologist: Berdine AddisonHOMAS, MATHAI Resident/CRNA: Malva CoganBEANE, Inza Mikrut Performed: resident/CRNA   Preanesthetic Checklist Completed: patient identified, site marked, surgical consent, pre-op evaluation, timeout performed, IV checked, risks and benefits discussed and monitors and equipment checked  Epidural Patient position: sitting Prep: Betadine Patient monitoring: heart rate, continuous pulse ox and blood pressure Approach: midline Location: L3-L4 Injection technique: LOR saline  Needle:  Needle type: Tuohy  Needle gauge: 17 G Needle length: 9 cm and 9 Needle insertion depth: 6 cm Catheter type: closed end flexible Catheter size: 19 Gauge Catheter at skin depth: 12 cm Test dose: negative and 1.5% lidocaine with Epi 1:200 K  Assessment Sensory level: T10 Events: blood not aspirated, injection not painful, no injection resistance, negative IV test and no paresthesia  Additional Notes Pt. Evaluated and documentation done after procedure finished. Patient identified. Risks/Benefits/Options discussed with patient including but not limited to bleeding, infection, nerve damage, paralysis, failed block, incomplete pain control, headache, blood pressure changes, nausea, vomiting, reactions to medication both or allergic, itching and postpartum back pain. Confirmed with bedside nurse the patient's most recent platelet count. Confirmed with patient that they are not currently taking any anticoagulation, have any bleeding history or any family history of bleeding disorders. Patient expressed understanding and wished to proceed. All questions were answered. Sterile technique was used throughout the entire procedure. Please see nursing notes for vital signs. Test dose was given through epidural catheter and negative prior to continuing to dose epidural or start infusion. Warning signs  of high block given to the patient including shortness of breath, tingling/numbness in hands, complete motor block, or any concerning symptoms with instructions to call for help. Patient was given instructions on fall risk and not to get out of bed. All questions and concerns addressed with instructions to call with any issues or inadequate analgesia.   Patient tolerated the insertion well without immediate complications.Reason for block:procedure for pain

## 2016-07-29 NOTE — Anesthesia Preprocedure Evaluation (Signed)
Anesthesia Evaluation  Patient identified by MRN, date of birth, ID band Patient awake    Reviewed: Allergy & Precautions, NPO status , Patient's Chart, lab work & pertinent test results, reviewed documented beta blocker date and time   Airway Mallampati: II  TM Distance: >3 FB     Dental  (+) Chipped   Pulmonary Current Smoker,           Cardiovascular      Neuro/Psych PSYCHIATRIC DISORDERS Depression    GI/Hepatic   Endo/Other    Renal/GU      Musculoskeletal   Abdominal   Peds  Hematology   Anesthesia Other Findings   Reproductive/Obstetrics                             Anesthesia Physical Anesthesia Plan  ASA: II  Anesthesia Plan: Epidural   Post-op Pain Management:    Induction:   Airway Management Planned:   Additional Equipment:   Intra-op Plan:   Post-operative Plan:   Informed Consent: I have reviewed the patients History and Physical, chart, labs and discussed the procedure including the risks, benefits and alternatives for the proposed anesthesia with the patient or authorized representative who has indicated his/her understanding and acceptance.     Plan Discussed with: CRNA  Anesthesia Plan Comments:         Anesthesia Quick Evaluation

## 2016-07-30 LAB — CBC
HCT: 34.5 % — ABNORMAL LOW (ref 35.0–47.0)
Hemoglobin: 12.1 g/dL (ref 12.0–16.0)
MCH: 30.7 pg (ref 26.0–34.0)
MCHC: 35 g/dL (ref 32.0–36.0)
MCV: 87.8 fL (ref 80.0–100.0)
Platelets: 129 10*3/uL — ABNORMAL LOW (ref 150–440)
RBC: 3.93 MIL/uL (ref 3.80–5.20)
RDW: 12.9 % (ref 11.5–14.5)
WBC: 15.6 10*3/uL — ABNORMAL HIGH (ref 3.6–11.0)

## 2016-07-30 LAB — RPR: RPR Ser Ql: NONREACTIVE

## 2016-07-30 MED ORDER — IBUPROFEN 600 MG PO TABS
600.0000 mg | ORAL_TABLET | Freq: Four times a day (QID) | ORAL | Status: DC
  Administered 2016-07-30 – 2016-07-31 (×6): 600 mg via ORAL
  Filled 2016-07-30 (×5): qty 1

## 2016-07-30 NOTE — Anesthesia Post-op Follow-up Note (Signed)
  Anesthesia Pain Follow-up Note  Patient: Brooke Bauer  Day #: 1  Date of Follow-up: 07/30/2016 Time: 7:28 AM  Last Vitals:  Vitals:   07/29/16 2234 07/30/16 0424  BP: 125/70 135/77  Pulse: 76 88  Resp: 18 18  Temp: 36.7 C 36.8 C    Level of Consciousness: alert  Pain: mild   Side Effects:None  Catheter Site Exam: site not evaluated     Plan: D/C from anesthesia care  Clydene PughBeane, Elzabeth Mcquerry D

## 2016-07-30 NOTE — Anesthesia Postprocedure Evaluation (Signed)
Anesthesia Post Note  Patient: Brooke Bauer  Procedure(s) Performed: * No procedures listed *  Patient location during evaluation: Mother Baby Anesthesia Type: Epidural Level of consciousness: awake and alert and oriented Pain management: satisfactory to patient Vital Signs Assessment: post-procedure vital signs reviewed and stable Respiratory status: respiratory function stable Cardiovascular status: blood pressure returned to baseline Postop Assessment: no headache, no backache, epidural receding, patient able to bend at knees, no signs of nausea or vomiting and adequate PO intake Anesthetic complications: no    Last Vitals:  Vitals:   07/29/16 2234 07/30/16 0424  BP: 125/70 135/77  Pulse: 76 88  Resp: 18 18  Temp: 36.7 C 36.8 C    Last Pain:  Vitals:   07/30/16 0424  TempSrc: Oral  PainSc:                  Clydene PughBeane, Aisling Emigh D

## 2016-07-30 NOTE — Progress Notes (Signed)
Post Partum Day 1 Subjective: no complaints, up ad lib, voiding and tolerating PO. Baby breastfeeding well.  Objective: Blood pressure (!) 114/55, pulse 79, temperature 98.2 F (36.8 C), temperature source Oral, resp. rate 18, height 5\' 3"  (1.6 m), weight 100.7 kg (222 lb), last menstrual period 09/30/2015, SpO2 99 %, unknown if currently breastfeeding.  Physical Exam:  General: alert, cooperative and no distress Lochia: appropriate Uterine Fundus: firm/ U-2/ ML/ NT DVT Evaluation: No evidence of DVT seen on physical exam.   Recent Labs  07/29/16 0011 07/30/16 0511  HGB 13.1 12.1  HCT 37.1 34.5*  WBC 17.7* 15.6*  PLT 156 129*    Assessment/Plan: Stable PPD #1 Plan for discharge tomorrow Breast Contraception: nexplanon VNI/RNI-vaccinate prior to discharge A POS   LOS: 1 day   Brooke Bauer 07/30/2016, 10:38 AM

## 2016-07-31 MED ORDER — IBUPROFEN 600 MG PO TABS
600.0000 mg | ORAL_TABLET | Freq: Four times a day (QID) | ORAL | 0 refills | Status: DC
Start: 2016-07-31 — End: 2017-11-22

## 2016-07-31 NOTE — Progress Notes (Signed)
Post Partum Day 1 Subjective: no complaints, up ad lib, voiding and tolerating PO. Baby breastfeeding well.  Objective: Blood pressure 124/68, pulse 84, temperature 98.4 F (36.9 C), temperature source Oral, resp. rate 18, height 5\' 3"  (1.6 m), weight 222 lb (100.7 kg), last menstrual period 09/30/2015, SpO2 99 %, unknown if currently breastfeeding.  Physical Exam:  General: alert, cooperative and no distress Lochia: appropriate Uterine Fundus: firm/ U-2/ ML/ NT DVT Evaluation: No evidence of DVT seen on physical exam.   Recent Labs  07/29/16 0011 07/30/16 0511  HGB 13.1 12.1  HCT 37.1 34.5*  WBC 17.7* 15.6*  PLT 156 129*    Assessment/Plan: Stable PPD #2 Plan for discharge  Breast Contraception: nexplanon VNI/RNI-vaccinate prior to discharge A POS   LOS: 2 days   Brooke Bauer 07/31/2016, 10:20 AM

## 2016-07-31 NOTE — Progress Notes (Signed)
D/C order from MD.  Reviewed d/c instructions and prescriptions with patient and answered any questions.  Patient d/c home with infant via wheelchair by nursing/auxillary. 

## 2016-07-31 NOTE — Discharge Instructions (Signed)
Please call your doctor or return to the ER if you experience any chest pains, shortness of breath, fever greater than 101, any heavy bleeding or large clots, and foul smelling vaginal discharge, any worsening abdominal pain & cramping that is not controlled by pain medication, or any signs of post partum depression.  No tampons, enemas, douches, or sexual intercourse for 6 weeks.  Also avoid tub baths, hot tubs, or swimming for 6 weeks.  Discharge instructions:   Call office if you have any of the following: headache, visual changes, fever >100 F, chills, breast concerns, excessive vaginal bleeding, incision drainage or problems, leg pain or redness, depression or any other concerns.   Activity: Do not lift > 10 lbs for 6 weeks.  No intercourse or tampons for 6 weeks.  No driving for 1-2 weeks.   Vaginal Delivery, Care After Refer to this sheet in the next few weeks. These discharge instructions provide you with information on caring for yourself after delivery. Your health care provider may also give you specific instructions. Your treatment has been planned according to the most current medical practices available, but problems sometimes occur. Call your health care provider if you have any problems or questions after you go home. HOME CARE INSTRUCTIONS  Take over-the-counter or prescription medicines only as directed by your health care provider or pharmacist.  Do not drink alcohol, especially if you are breastfeeding or taking medicine to relieve pain.  Do not chew or smoke tobacco.  Do not use illegal drugs.  Continue to use good perineal care. Good perineal care includes:  Wiping your perineum from front to back.  Keeping your perineum clean.  Do not use tampons or douche until your health care provider says it is okay.  Shower, wash your hair, and take tub baths as directed by your health care provider.  Wear a well-fitting bra that provides breast support.  Eat healthy  foods.  Drink enough fluids to keep your urine clear or pale yellow.  Eat high-fiber foods such as whole grain cereals and breads, brown rice, beans, and fresh fruits and vegetables every day. These foods may help prevent or relieve constipation.  Follow your health care provider's recommendations regarding resumption of activities such as climbing stairs, driving, lifting, exercising, or traveling.  Talk to your health care provider about resuming sexual activities. Resumption of sexual activities is dependent upon your risk of infection, your rate of healing, and your comfort and desire to resume sexual activity.  Try to have someone help you with your household activities and your newborn for at least a few days after you leave the hospital.  Rest as much as possible. Try to rest or take a nap when your newborn is sleeping.  Increase your activities gradually.  Keep all of your scheduled postpartum appointments. It is very important to keep your scheduled follow-up appointments. At these appointments, your health care provider will be checking to make sure that you are healing physically and emotionally. SEEK MEDICAL CARE IF:   You are passing large clots from your vagina. Save any clots to show your health care provider.  You have a foul smelling discharge from your vagina.  You have trouble urinating.  You are urinating frequently.  You have pain when you urinate.  You have a change in your bowel movements.  You have increasing redness, pain, or swelling near your vaginal incision (episiotomy) or vaginal tear.  You have pus draining from your episiotomy or vaginal tear.  Your  episiotomy or vaginal tear is separating.  You have painful, hard, or reddened breasts.  You have a severe headache.  You have blurred vision or see spots.  You feel sad or depressed.  You have thoughts of hurting yourself or your newborn.  You have questions about your care, the care of your  newborn, or medicines.  You are dizzy or light-headed.  You have a rash.  You have nausea or vomiting.  You were breastfeeding and have not had a menstrual period within 12 weeks after you stopped breastfeeding.  You are not breastfeeding and have not had a menstrual period by the 12th week after delivery.  You have a fever. SEEK IMMEDIATE MEDICAL CARE IF:   You have persistent pain.  You have chest pain.  You have shortness of breath.  You faint.  You have leg pain.  You have stomach pain.  Your vaginal bleeding saturates two or more sanitary pads in 1 hour.   This information is not intended to replace advice given to you by your health care provider. Make sure you discuss any questions you have with your health care provider.   Document Released: 11/29/2000 Document Revised: 08/23/2015 Document Reviewed: 07/29/2012 Elsevier Interactive Patient Education [employer]2016 [employer] Inc.

## 2017-01-12 IMAGING — CR CERVICAL SPINE - 2-3 VIEW
1 series · 4 of 4 positions shown · non-contrast
Comparison: None.

CLINICAL DATA: Neck pain extending into the left arm for 2 days. No
known injury.

EXAM:
CERVICAL SPINE - 2-3 VIEW

[Series 1: w cervical spine lat · 0.14mm/px · 4 of 4 slices shown]
[im 1/4]
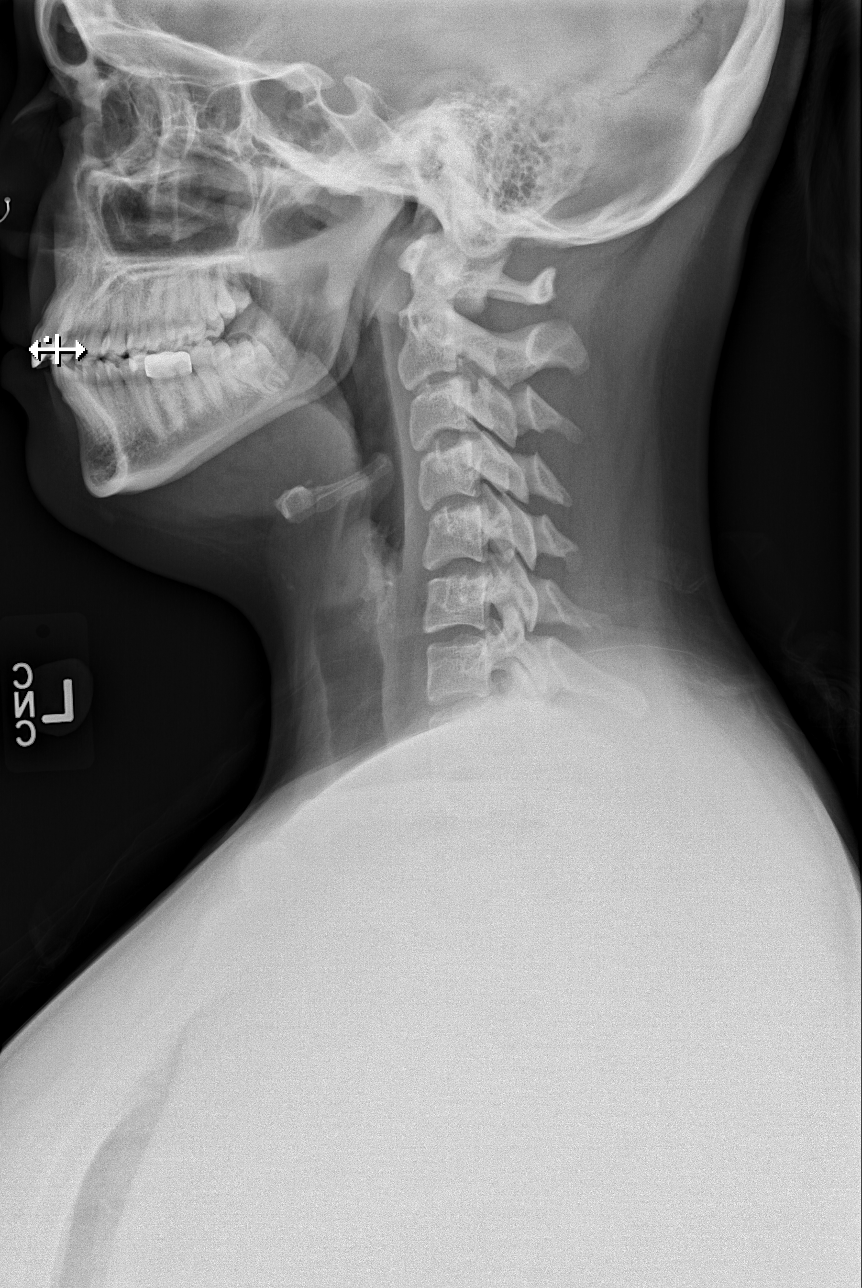
[im 2/4]
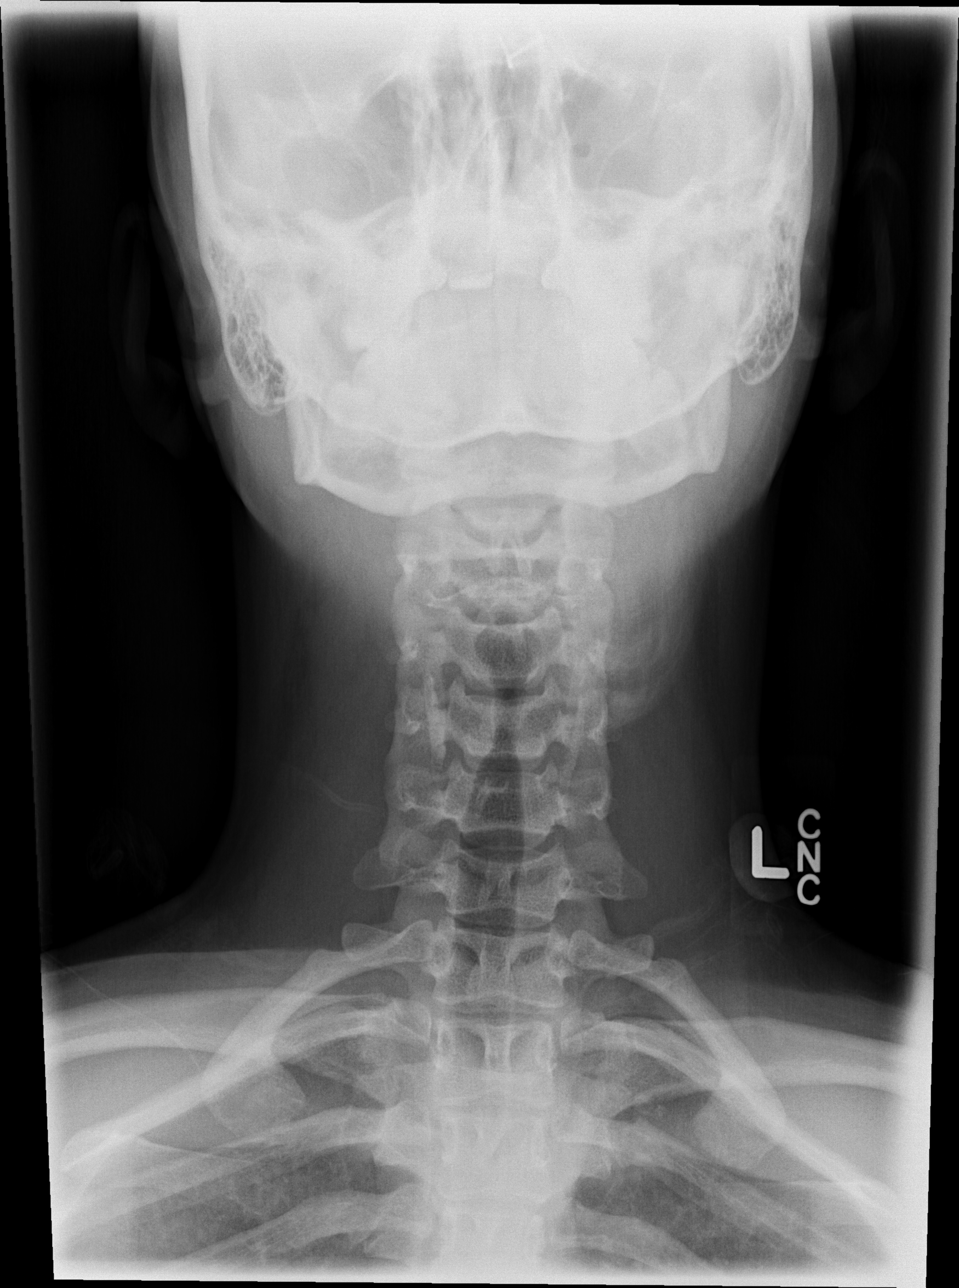
[im 3/4]
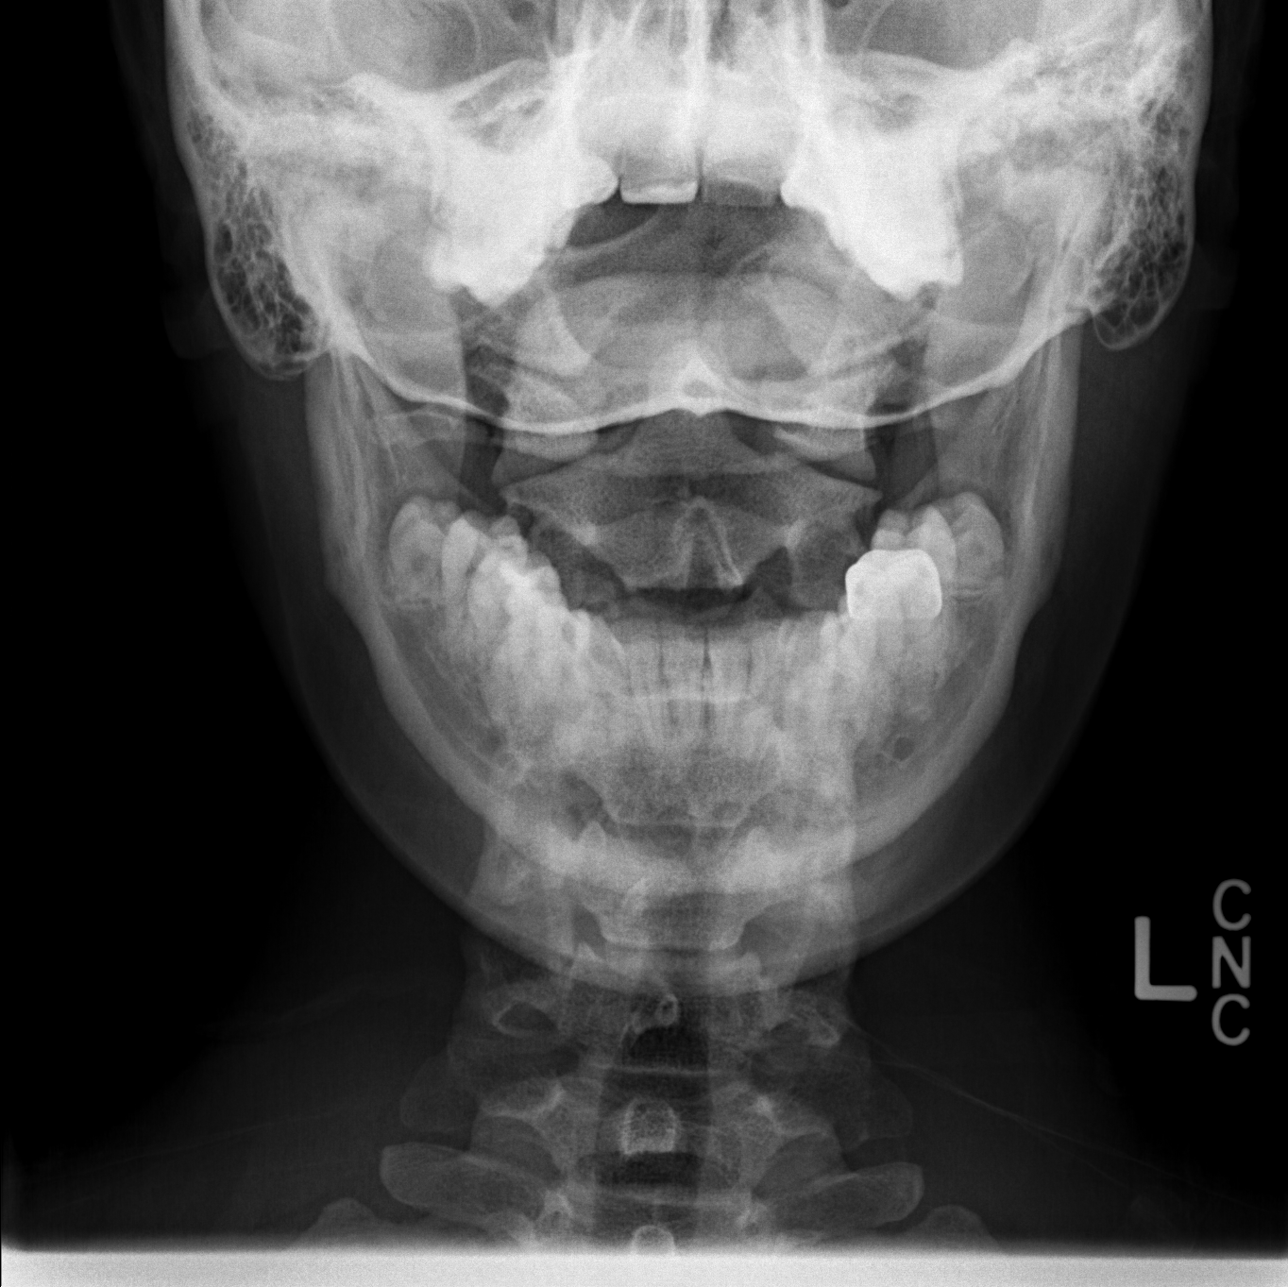
[im 4/4]
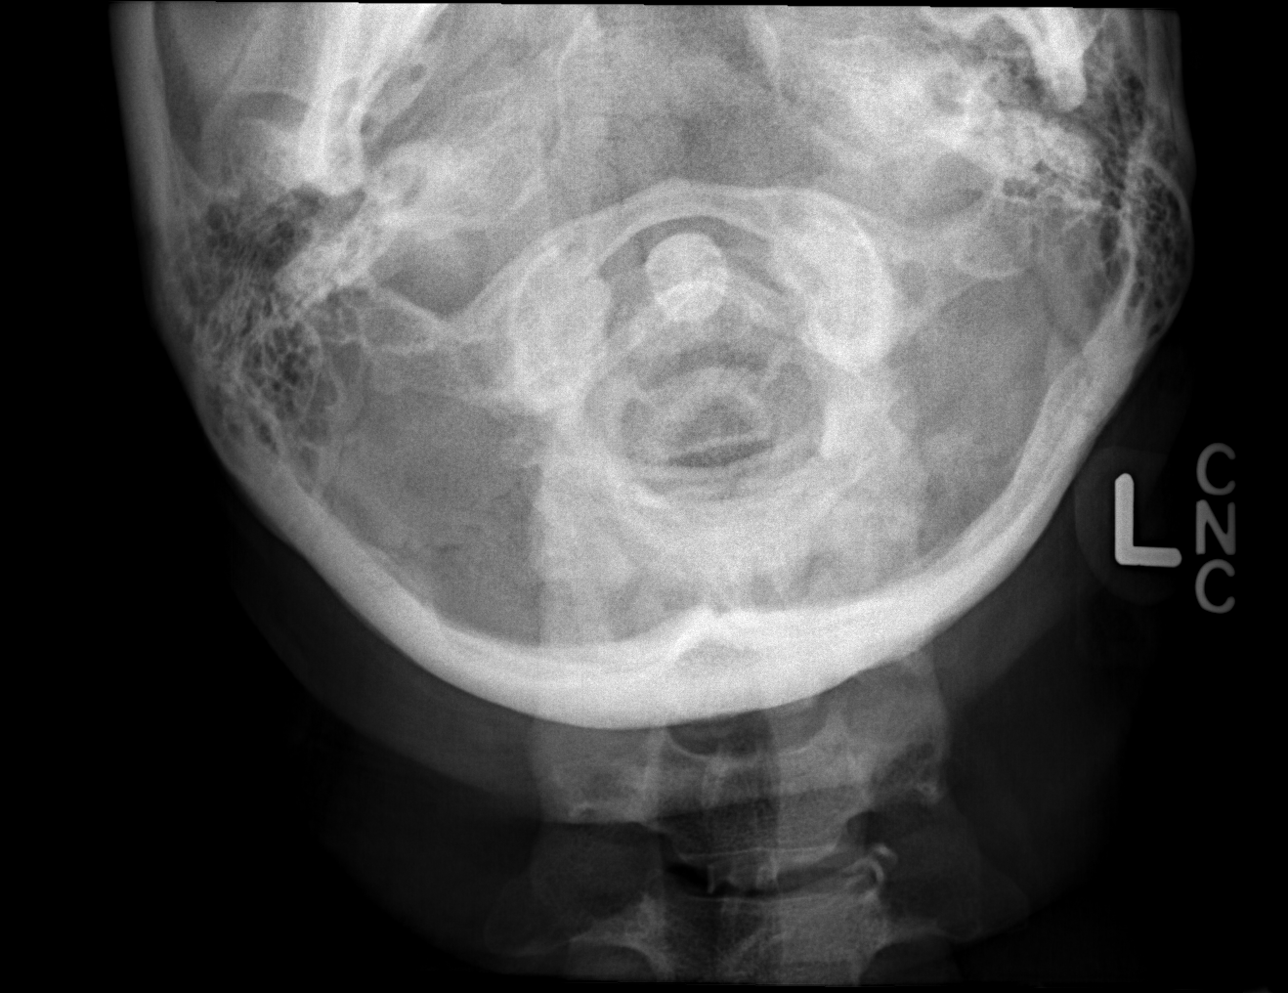

[4 of 4 positions shown; findings below may reference images not displayed]

FINDINGS: There is no fracture, subluxation, prevertebral soft tissue
swelling, facet arthritis, or disc space narrowing.
IMPRESSION: Normal exam.

## 2017-07-12 ENCOUNTER — Emergency Department
Admission: EM | Admit: 2017-07-12 | Discharge: 2017-07-12 | Disposition: A | Discharge status: Home / Self Care | Payer: Medicaid Other | Attending: Emergency Medicine | Admitting: Emergency Medicine

## 2017-07-12 ENCOUNTER — Encounter: Payer: Self-pay | Admitting: Emergency Medicine

## 2017-07-12 DIAGNOSIS — Z791 Long term (current) use of non-steroidal anti-inflammatories (NSAID): Secondary | ICD-10-CM | POA: Diagnosis not present

## 2017-07-12 DIAGNOSIS — F17210 Nicotine dependence, cigarettes, uncomplicated: Secondary | ICD-10-CM | POA: Diagnosis not present

## 2017-07-12 DIAGNOSIS — R55 Syncope and collapse: Secondary | ICD-10-CM | POA: Insufficient documentation

## 2017-07-12 DIAGNOSIS — N3090 Cystitis, unspecified without hematuria: Secondary | ICD-10-CM | POA: Diagnosis not present

## 2017-07-12 DIAGNOSIS — R51 Headache: Secondary | ICD-10-CM | POA: Diagnosis present

## 2017-07-12 LAB — URINALYSIS, COMPLETE (UACMP) WITH MICROSCOPIC
BILIRUBIN URINE: NEGATIVE
GLUCOSE, UA: NEGATIVE mg/dL
Ketones, ur: NEGATIVE mg/dL
LEUKOCYTES UA: NEGATIVE
NITRITE: NEGATIVE
PH: 5 (ref 5.0–8.0)
Protein, ur: NEGATIVE mg/dL
SPECIFIC GRAVITY, URINE: 1.029 (ref 1.005–1.030)

## 2017-07-12 LAB — CBC
HCT: 41.6 % (ref 35.0–47.0)
Hemoglobin: 14.2 g/dL (ref 12.0–16.0)
MCH: 28.9 pg (ref 26.0–34.0)
MCHC: 34.2 g/dL (ref 32.0–36.0)
MCV: 84.5 fL (ref 80.0–100.0)
PLATELETS: 202 10*3/uL (ref 150–440)
RBC: 4.93 MIL/uL (ref 3.80–5.20)
RDW: 12.5 % (ref 11.5–14.5)
WBC: 9.1 10*3/uL (ref 3.6–11.0)

## 2017-07-12 LAB — LIPASE, BLOOD: Lipase: 24 U/L (ref 11–51)

## 2017-07-12 LAB — COMPREHENSIVE METABOLIC PANEL
ALBUMIN: 4.3 g/dL (ref 3.5–5.0)
ALT: 17 U/L (ref 14–54)
AST: 18 U/L (ref 15–41)
Alkaline Phosphatase: 64 U/L (ref 38–126)
Anion gap: 8 (ref 5–15)
BILIRUBIN TOTAL: 0.5 mg/dL (ref 0.3–1.2)
BUN: 16 mg/dL (ref 6–20)
CO2: 23 mmol/L (ref 22–32)
CREATININE: 0.63 mg/dL (ref 0.44–1.00)
Calcium: 9.3 mg/dL (ref 8.9–10.3)
Chloride: 108 mmol/L (ref 101–111)
GFR calc Af Amer: >60 mL/min (ref >60)
Glucose, Bld: 94 mg/dL (ref 65–99)
POTASSIUM: 3.8 mmol/L (ref 3.5–5.1)
Sodium: 139 mmol/L (ref 135–145)
TOTAL PROTEIN: 7.8 g/dL (ref 6.5–8.1)

## 2017-07-12 LAB — POCT PREGNANCY, URINE: Preg Test, Ur: NEGATIVE

## 2017-07-12 MED ORDER — SULFAMETHOXAZOLE-TRIMETHOPRIM 800-160 MG PO TABS: 1.0000 | ORAL_TABLET | Freq: Two times a day (BID) | ORAL | 0 refills | Status: DC

## 2017-07-12 NOTE — ED Triage Notes (Signed)
States woke with headache during night. Headache subsided. Abdominal pain this am. States had near syncopal episode at work.

## 2017-07-12 NOTE — Discharge Instructions (Signed)
Take antibiotic as prescribed. If you notice symptoms worsen return to emergency department. If he began to experience any signs or symptoms of dizziness or feeling as if you are going to pass out return to the emergency department.

## 2017-07-12 NOTE — ED Triage Notes (Signed)
Mom questions length of time for bed available. Considering going to another facility. Chart reviewed with flex. PA agreeable to seeing patient in flex.

## 2017-07-12 NOTE — ED Notes (Signed)
Pt verbalized understanding of discharge instructions. NAD at this time. 

## 2017-07-12 NOTE — ED Provider Notes (Signed)
[facility] Emergency Department Provider Note   ____________________________________________   I have reviewed the triage vital signs and the nursing notes.   HISTORY  Chief Complaint Headache and Abdominal Pain    HPI Brooke Bauer is a 21 y.o. female presented to the emergency department following a syncopal episode at work earlier this morning. Patient reports prior to work she experienced headache that awoke her with the headache subsiding. 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 As the pain intensified she experienced a syncopal episode without traumatic injury. Patient regained consciousness and returned to her baseline. She noted the abdominal pain resolving and did not have any other presyncopal symptoms. Patient denies any recent illnesses, increased stressors, changes in medications or recent injuries. Patient denies fever, chills, headache, vision changes, chest pain, chest tightness, shortness of breath, abdominal pain, nausea and vomiting.  Past Medical History:  Diagnosis Date  . Depression    no meds x 5 years    Patient Active Problem List   Diagnosis Date Noted  . Labor and delivery indication for care or intervention 07/29/2016  . Indication for care in labor or delivery 07/26/2016  . Urinary incontinence 06/18/2016    Past Surgical History:  Procedure Laterality Date  . APPENDECTOMY      Prior to Admission medications   Medication Sig Start Date End Date Taking? Authorizing Provider  ibuprofen (ADVIL,MOTRIN) 600 MG tablet Take 1 tablet (600 mg total) by mouth every 6 (six) hours. 07/31/16   Nadara MustardHarris, Robert P, MD  Prenatal Vit-Fe Fumarate-FA (MULTIVITAMIN-PRENATAL) 27-0.8 MG TABS tablet Take 1 tablet by mouth daily at 12 noon.    [provider]  ranitidine (ZANTAC) 75 MG tablet Take 1 tablet (75 mg total) by mouth 2 (two) times daily. Patient not taking: Reported on  06/18/2016 12/05/15   Myrna BlazerSchaevitz, David Matthew, MD  sulfamethoxazole-trimethoprim (BACTRIM DS,SEPTRA DS) 800-160 MG tablet Take 1 tablet by mouth 2 (two) times daily. 07/12/17   Dazha Kempa M, PA-C    Allergies Patient has no known allergies.  Family History  Problem Relation Age of Onset  . Hypertension Mother   . Hypertension Father   . Hypertension Maternal Grandfather     Social History Social History  Substance Use Topics  . Smoking status: Current Every Day Smoker    Packs/day: 1.00    Types: Cigarettes  . Smokeless tobacco: Never Used  . Alcohol use No    Review of Systems Constitutional: Negative for fever/chills Eyes: No visual changes. ENT:  Negative for sore throat and for difficulty swallowing Cardiovascular: Denies chest pain. Respiratory: Denies cough. Denies shortness of breath. Gastrointestinal: No abdominal pain.  No nausea, vomiting, diarrhea. Genitourinary: Negative for dysuria. Musculoskeletal: Negative for back pain. Skin: Negative for rash. Neurological: Negative for headaches.  Negative focal weakness or numbness. Negative for loss of consciousness. Able to ambulate. ____________________________________________   PHYSICAL EXAM:  VITAL SIGNS: ED Triage Vitals  Enc Vitals Group     BP 07/12/17 1205 135/79     Pulse Rate 07/12/17 1205 79     Resp 07/12/17 1205 20     Temp 07/12/17 1205 98.4 F (36.9 C)     Temp Source 07/12/17 1205 Oral     SpO2 07/12/17 1205 99 %     Weight 07/12/17 1207 200 lb (90.7 kg)     Height 07/12/17 1207 5\' 5"  (1.651 m)     Head Circumference --  Peak Flow --      Pain Score 07/12/17 1205 6     Pain Loc --      Pain Edu? --      Excl. in GC? --     Constitutional: Alert and oriented. Well appearing and in no acute distress.  Head: Normocephalic and atraumatic. Eyes: Conjunctivae are normal. PERRL. Normal extraocular movements. Nose: No congestion/rhinorrhea/epistaxis. Mouth/Throat: Mucous membranes are  moist. Neck: Supple/ Hematological/Lymphatic/Immunological: No cervical lymphadenopathy. Cardiovascular: Normal rate, regular rhythm. Normal distal pulses. Respiratory: Normal respiratory effort. No wheezes/rales/rhonchi. Lungs CTAB with no W/R/R. Gastrointestinal: Soft and nontender. No distention. Genitourinary: Negative for dysuria, increased or decreased urinary frequency. Denies pelvic or flank pain. Negative CVA tenderness bilaterally.  Musculoskeletal: Nontender with normal range of motion in all extremities. Neurologic: Normal speech and language. No gross focal neurologic deficits are appreciated.  Skin:  Skin is warm, dry and intact. No rash noted. Psychiatric: Mood and affect are normal.  ____________________________________________   LABS (all labs ordered are listed, but only abnormal results are displayed)  Labs Reviewed  URINALYSIS, COMPLETE (UACMP) WITH MICROSCOPIC - Abnormal; Notable for the following:       Result Value   Color, Urine YELLOW (*)    APPearance HAZY (*)    Hgb urine dipstick SMALL (*)    Bacteria, UA MANY (*)    Squamous Epithelial / LPF 0-5 (*)    All other components within normal limits  LIPASE, BLOOD  COMPREHENSIVE METABOLIC PANEL  CBC  POC URINE PREG, ED  POCT PREGNANCY, URINE   ____________________________________________  EKG None ____________________________________________  RADIOLOGY None ____________________________________________   PROCEDURES  Procedure(s) performed: no    Critical Care performed: no ____________________________________________   INITIAL IMPRESSION / ASSESSMENT AND PLAN / ED COURSE  Pertinent labs & imaging results that were available during my care of the patient were reviewed by me and considered in my medical decision making (see chart for details).   Patient presented to the emergency department following a syncopal episode. History, physical exam vital signs were reassuring during the course  care in the emergency department. Lab findings revealed abnormal urinalysis consistent with cystitis. Patient is afebrile, asymptomatic and denies flank or back pain.  Patient will be given a prescription in for Bactrim and encouraged to hydrate. Discussed with the patient if she noted despite taking antibiotics prescribed development or worsening of urinary or kidney symptoms to return to the emergency department. Patient informed of clinical course, understand medical decision-making process, and agree with plan. Patient informed of clinical course, understand medical decision-making process, and agree with plan.  Patient was advised to follow up with PCP as needed and was also advised to return to the emergency department for symptoms that change or worsen.      ____________________________________________   FINAL CLINICAL IMPRESSION(S) / ED DIAGNOSES  Final diagnoses:  Cystitis  Syncope, unspecified syncope type       NEW MEDICATIONS STARTED DURING THIS VISIT:  Discharge Medication List as of 07/12/2017  3:24 PM    START taking these medications   Details  sulfamethoxazole-trimethoprim (BACTRIM DS,SEPTRA DS) 800-160 MG tablet Take 1 tablet by mouth 2 (two) times daily., Starting Sat 07/12/2017, Print         Note:  This document was prepared using Dragon voice recognition software and may include unintentional dictation errors.    Clois ComberLittle, Nickola Lenig M, Cordelia Poche-C 07/12/17 1924    Jeanmarie PlantMcShane, James A, MD 07/13/17 [phone]

## 2017-11-22 ENCOUNTER — Other Ambulatory Visit: Payer: Self-pay

## 2017-11-22 ENCOUNTER — Emergency Department: Payer: No Typology Code available for payment source

## 2017-11-22 ENCOUNTER — Encounter: Payer: Self-pay | Admitting: Emergency Medicine

## 2017-11-22 ENCOUNTER — Emergency Department
Admission: EM | Admit: 2017-11-22 | Discharge: 2017-11-22 | Disposition: A | Discharge status: Home / Self Care | Payer: No Typology Code available for payment source | Attending: Emergency Medicine | Admitting: Emergency Medicine

## 2017-11-22 DIAGNOSIS — Y9389 Activity, other specified: Secondary | ICD-10-CM | POA: Diagnosis not present

## 2017-11-22 DIAGNOSIS — Y999 Unspecified external cause status: Secondary | ICD-10-CM | POA: Insufficient documentation

## 2017-11-22 DIAGNOSIS — R0789 Other chest pain: Secondary | ICD-10-CM | POA: Diagnosis present

## 2017-11-22 DIAGNOSIS — Z79899 Other long term (current) drug therapy: Secondary | ICD-10-CM | POA: Diagnosis not present

## 2017-11-22 DIAGNOSIS — Y929 Unspecified place or not applicable: Secondary | ICD-10-CM | POA: Insufficient documentation

## 2017-11-22 DIAGNOSIS — F17210 Nicotine dependence, cigarettes, uncomplicated: Secondary | ICD-10-CM | POA: Diagnosis not present

## 2017-11-22 LAB — CBC
HCT: 40.1 % (ref 35.0–47.0)
HEMOGLOBIN: 13.9 g/dL (ref 12.0–16.0)
MCH: 29.2 pg (ref 26.0–34.0)
MCHC: 34.6 g/dL (ref 32.0–36.0)
MCV: 84.2 fL (ref 80.0–100.0)
PLATELETS: 174 10*3/uL (ref 150–440)
RBC: 4.77 MIL/uL (ref 3.80–5.20)
RDW: 13.1 % (ref 11.5–14.5)
WBC: 8.6 10*3/uL (ref 3.6–11.0)

## 2017-11-22 LAB — COMPREHENSIVE METABOLIC PANEL
ALBUMIN: 3.8 g/dL (ref 3.5–5.0)
ALK PHOS: 69 U/L (ref 38–126)
ALT: 17 U/L (ref 14–54)
ANION GAP: 6 (ref 5–15)
AST: 15 U/L (ref 15–41)
BUN: 15 mg/dL (ref 6–20)
CALCIUM: 8.8 mg/dL — AB (ref 8.9–10.3)
CHLORIDE: 108 mmol/L (ref 101–111)
CO2: 21 mmol/L — AB (ref 22–32)
CREATININE: 0.61 mg/dL (ref 0.44–1.00)
GFR calc non Af Amer: >60 mL/min (ref >60)
GLUCOSE: 102 mg/dL — AB (ref 65–99)
Potassium: 3.7 mmol/L (ref 3.5–5.1)
SODIUM: 135 mmol/L (ref 135–145)
Total Bilirubin: 0.4 mg/dL (ref 0.3–1.2)
Total Protein: 7.2 g/dL (ref 6.5–8.1)

## 2017-11-22 LAB — URINALYSIS, COMPLETE (UACMP) WITH MICROSCOPIC
BACTERIA UA: NONE SEEN
BILIRUBIN URINE: NEGATIVE
Glucose, UA: NEGATIVE mg/dL
HGB URINE DIPSTICK: NEGATIVE
KETONES UR: NEGATIVE mg/dL
LEUKOCYTES UA: NEGATIVE
NITRITE: NEGATIVE
PROTEIN: NEGATIVE mg/dL
SPECIFIC GRAVITY, URINE: 1.029 (ref 1.005–1.030)
pH: 5 (ref 5.0–8.0)

## 2017-11-22 MED ORDER — ORPHENADRINE CITRATE 30 MG/ML IJ SOLN
60.0000 mg | Freq: Two times a day (BID) | INTRAMUSCULAR | Status: DC
  Administered 2017-11-22: 60 mg via INTRAMUSCULAR
  Filled 2017-11-22: qty 2

## 2017-11-22 MED ORDER — IBUPROFEN 600 MG PO TABS: 600.0000 mg | ORAL_TABLET | Freq: Four times a day (QID) | ORAL | 0 refills | Status: DC | PRN

## 2017-11-22 MED ORDER — DIAZEPAM 5 MG PO TABS
5.0000 mg | ORAL_TABLET | Freq: Once | ORAL | Status: AC
  Administered 2017-11-22: 5 mg via ORAL
  Filled 2017-11-22: qty 1

## 2017-11-22 MED ORDER — CYCLOBENZAPRINE HCL 5 MG PO TABS: ORAL_TABLET | ORAL | 0 refills | Status: DC

## 2017-11-22 MED ORDER — KETOROLAC TROMETHAMINE 30 MG/ML IJ SOLN
30.0000 mg | Freq: Once | INTRAMUSCULAR | Status: AC
  Administered 2017-11-22: 30 mg via INTRAMUSCULAR
  Filled 2017-11-22: qty 1

## 2017-11-22 NOTE — Discharge Instructions (Signed)
No driving until appointment with primary care.

## 2017-11-22 NOTE — ED Notes (Signed)
See triage note for assessment. Pt skin warm and dry.  Pt reports some mild neck pain as well and right hand and wrist pain.

## 2017-11-22 NOTE — ED Provider Notes (Signed)
ED ECG REPORT I, Merrily BrittleNeil Berania Peedin, the attending physician, personally viewed and interpreted this ECG.  Date: 11/22/2017 EKG Time:  Rate: 77 Rhythm: normal sinus rhythm QRS Axis: normal Intervals: normal ST/T Wave abnormalities: normal Narrative Interpretation: no evidence of acute ischemia    Merrily Brittleifenbark, Abbagale Goguen, MD 11/22/17 [id number]

## 2017-11-22 NOTE — ED Notes (Signed)
Urine preg negative

## 2017-11-22 NOTE — ED Notes (Signed)
EKG signed by Dr. Lamont Snowballifenbark
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
## 2017-11-24 LAB — POCT PREGNANCY, URINE: Preg Test, Ur: NEGATIVE

## 2019-05-27 ENCOUNTER — Other Ambulatory Visit: Payer: Self-pay

## 2019-05-27 ENCOUNTER — Emergency Department
Admission: EM | Admit: 2019-05-27 | Discharge: 2019-05-28 | Disposition: A | Discharge status: Home / Self Care | Payer: Medicaid Other | Attending: Emergency Medicine | Admitting: Emergency Medicine

## 2019-05-27 DIAGNOSIS — R45851 Suicidal ideations: Secondary | ICD-10-CM | POA: Insufficient documentation

## 2019-05-27 DIAGNOSIS — F32A Depression, unspecified: Secondary | ICD-10-CM

## 2019-05-27 DIAGNOSIS — F419 Anxiety disorder, unspecified: Secondary | ICD-10-CM | POA: Insufficient documentation

## 2019-05-27 DIAGNOSIS — F17210 Nicotine dependence, cigarettes, uncomplicated: Secondary | ICD-10-CM | POA: Insufficient documentation

## 2019-05-27 DIAGNOSIS — F329 Major depressive disorder, single episode, unspecified: Secondary | ICD-10-CM | POA: Diagnosis present

## 2019-05-27 DIAGNOSIS — Z79899 Other long term (current) drug therapy: Secondary | ICD-10-CM | POA: Insufficient documentation

## 2019-05-27 DIAGNOSIS — F321 Major depressive disorder, single episode, moderate: Secondary | ICD-10-CM | POA: Diagnosis not present

## 2019-05-27 DIAGNOSIS — F339 Major depressive disorder, recurrent, unspecified: Secondary | ICD-10-CM | POA: Diagnosis present

## 2019-05-27 HISTORY — DX: Anxiety disorder, unspecified: F41.9

## 2019-05-27 LAB — CBC
HCT: 39.2 % (ref 36.0–46.0)
Hemoglobin: 13.1 g/dL (ref 12.0–15.0)
MCH: 28.5 pg (ref 26.0–34.0)
MCHC: 33.4 g/dL (ref 30.0–36.0)
MCV: 85.4 fL (ref 80.0–100.0)
Platelets: 211 10*3/uL (ref 150–400)
RBC: 4.59 MIL/uL (ref 3.87–5.11)
RDW: 12.4 % (ref 11.5–15.5)
WBC: 12 10*3/uL — ABNORMAL HIGH (ref 4.0–10.5)
nRBC: 0 % (ref 0.0–0.2)

## 2019-05-27 LAB — URINE DRUG SCREEN, QUALITATIVE (ARMC ONLY)
Amphetamines, Ur Screen: NOT DETECTED
Barbiturates, Ur Screen: NOT DETECTED
Benzodiazepine, Ur Scrn: NOT DETECTED
Cannabinoid 50 Ng, Ur ~~LOC~~: NOT DETECTED
Cocaine Metabolite,Ur ~~LOC~~: NOT DETECTED
MDMA (Ecstasy)Ur Screen: NOT DETECTED
Methadone Scn, Ur: NOT DETECTED
Opiate, Ur Screen: NOT DETECTED
Phencyclidine (PCP) Ur S: NOT DETECTED
Tricyclic, Ur Screen: NOT DETECTED

## 2019-05-27 LAB — ACETAMINOPHEN LEVEL: Acetaminophen (Tylenol), Serum: <10 ug/mL — ABNORMAL LOW (ref 10–30)

## 2019-05-27 LAB — COMPREHENSIVE METABOLIC PANEL
ALT: 18 U/L (ref 0–44)
AST: 18 U/L (ref 15–41)
Albumin: 4.1 g/dL (ref 3.5–5.0)
Alkaline Phosphatase: 73 U/L (ref 38–126)
Anion gap: 4 — ABNORMAL LOW (ref 5–15)
BUN: 14 mg/dL (ref 6–20)
CO2: 23 mmol/L (ref 22–32)
Calcium: 8.9 mg/dL (ref 8.9–10.3)
Chloride: 106 mmol/L (ref 98–111)
Creatinine, Ser: 0.56 mg/dL (ref 0.44–1.00)
GFR calc Af Amer: >60 mL/min (ref >60)
GFR calc non Af Amer: >60 mL/min (ref >60)
Glucose, Bld: 103 mg/dL — ABNORMAL HIGH (ref 70–99)
Potassium: 3.5 mmol/L (ref 3.5–5.1)
Sodium: 133 mmol/L — ABNORMAL LOW (ref 135–145)
Total Bilirubin: 0.5 mg/dL (ref 0.3–1.2)
Total Protein: 7.6 g/dL (ref 6.5–8.1)

## 2019-05-27 LAB — PREGNANCY, URINE: Preg Test, Ur: NEGATIVE

## 2019-05-27 LAB — SALICYLATE LEVEL: Salicylate Lvl: <7 mg/dL (ref 2.8–30.0)

## 2019-05-27 LAB — ETHANOL: Alcohol, Ethyl (B): <10 mg/dL (ref <10)

## 2019-05-27 NOTE — ED Notes (Signed)
Psychiatry at bedside.

## 2019-05-27 NOTE — ED Notes (Signed)
Patient changed into hospital provided scrubs by this RN and by Laury Axon NT. Patient's belonging's placed into labeled bag. Belonging's include:  1 silver-colored ring with clear stones, 1 silver-colored ring with 1 larger clear stone and several small clear stones, 1 silver colored belly ring, 2 silver-colored studs, black tank top, black t-shirt, black pants, pair white socks, pair grey sneakers.
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
## 2019-05-28 DIAGNOSIS — F339 Major depressive disorder, recurrent, unspecified: Secondary | ICD-10-CM | POA: Diagnosis present

## 2019-05-28 DIAGNOSIS — F333 Major depressive disorder, recurrent, severe with psychotic symptoms: Secondary | ICD-10-CM

## 2019-05-28 NOTE — ED Provider Notes (Signed)
-----------------------------------------   12:10 AM on 05/28/2019 -----------------------------------------  Patient was evaluated by psychiatric NP who discussed the case with psychiatrist Dr. Barrington Ellison.  They deem her psychiatrically stable for discharge home with follow-up in the morning either with [facility] or [facility].  Strict return precautions given.  Patient verbalizes understanding and agrees with plan of care.   Paulette Blanch, MD 05/29/19 [id number]
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
## 2019-05-28 NOTE — ED Notes (Signed)
Reviewed discharge instructions and follow-up care with patient. Patient verbalized understanding of all information reviewed. Patient stable, with no distress noted at this time.    

## 2019-05-28 NOTE — Discharge Instructions (Addendum)
Please see a counselor at [facility] or [facility] this morning.  Return to the ER for worsening symptoms, feelings of hurting yourself or others, or other concerns.
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
## 2021-10-23 DIAGNOSIS — F17200 Nicotine dependence, unspecified, uncomplicated: Secondary | ICD-10-CM | POA: Insufficient documentation

## 2022-01-26 ENCOUNTER — Other Ambulatory Visit: Payer: Self-pay

## 2022-01-26 ENCOUNTER — Emergency Department: Payer: Medicaid Other

## 2022-01-26 ENCOUNTER — Emergency Department
Admission: EM | Admit: 2022-01-26 | Discharge: 2022-01-26 | Disposition: A | Discharge status: Home / Self Care | Payer: Medicaid Other | Attending: Emergency Medicine | Admitting: Emergency Medicine

## 2022-01-26 DIAGNOSIS — M130 Polyarthritis, unspecified: Secondary | ICD-10-CM | POA: Insufficient documentation

## 2022-01-26 DIAGNOSIS — L089 Local infection of the skin and subcutaneous tissue, unspecified: Secondary | ICD-10-CM | POA: Diagnosis present

## 2022-01-26 DIAGNOSIS — Z20822 Contact with and (suspected) exposure to covid-19: Secondary | ICD-10-CM | POA: Insufficient documentation

## 2022-01-26 DIAGNOSIS — L03311 Cellulitis of abdominal wall: Secondary | ICD-10-CM | POA: Insufficient documentation

## 2022-01-26 DIAGNOSIS — L02211 Cutaneous abscess of abdominal wall: Secondary | ICD-10-CM | POA: Diagnosis not present

## 2022-01-26 DIAGNOSIS — F419 Anxiety disorder, unspecified: Secondary | ICD-10-CM | POA: Insufficient documentation

## 2022-01-26 DIAGNOSIS — M2550 Pain in unspecified joint: Secondary | ICD-10-CM

## 2022-01-26 LAB — COMPREHENSIVE METABOLIC PANEL
ALT: 25 U/L (ref 0–44)
AST: 22 U/L (ref 15–41)
Albumin: 4 g/dL (ref 3.5–5.0)
Alkaline Phosphatase: 63 U/L (ref 38–126)
Anion gap: 7 (ref 5–15)
BUN: 9 mg/dL (ref 6–20)
CO2: 22 mmol/L (ref 22–32)
Calcium: 8.7 mg/dL — ABNORMAL LOW (ref 8.9–10.3)
Chloride: 105 mmol/L (ref 98–111)
Creatinine, Ser: 0.59 mg/dL (ref 0.44–1.00)
GFR, Estimated: >60 mL/min (ref >60)
Glucose, Bld: 100 mg/dL — ABNORMAL HIGH (ref 70–99)
Potassium: 3.5 mmol/L (ref 3.5–5.1)
Sodium: 134 mmol/L — ABNORMAL LOW (ref 135–145)
Total Bilirubin: 0.6 mg/dL (ref 0.3–1.2)
Total Protein: 7.3 g/dL (ref 6.5–8.1)

## 2022-01-26 LAB — URINALYSIS, ROUTINE W REFLEX MICROSCOPIC
Bilirubin Urine: NEGATIVE
Glucose, UA: NEGATIVE mg/dL
Hgb urine dipstick: NEGATIVE
Ketones, ur: NEGATIVE mg/dL
Leukocytes,Ua: NEGATIVE
Nitrite: POSITIVE — AB
Protein, ur: NEGATIVE mg/dL
Specific Gravity, Urine: 1.019 (ref 1.005–1.030)
pH: 6 (ref 5.0–8.0)

## 2022-01-26 LAB — CBC WITH DIFFERENTIAL/PLATELET
Abs Immature Granulocytes: 0.03 10*3/uL (ref 0.00–0.07)
Basophils Absolute: 0 10*3/uL (ref 0.0–0.1)
Basophils Relative: 1 %
Eosinophils Absolute: 0 10*3/uL (ref 0.0–0.5)
Eosinophils Relative: 1 %
HCT: 38.4 % (ref 36.0–46.0)
Hemoglobin: 12.9 g/dL (ref 12.0–15.0)
Immature Granulocytes: 1 %
Lymphocytes Relative: 21 %
Lymphs Abs: 1.1 10*3/uL (ref 0.7–4.0)
MCH: 29.1 pg (ref 26.0–34.0)
MCHC: 33.6 g/dL (ref 30.0–36.0)
MCV: 86.7 fL (ref 80.0–100.0)
Monocytes Absolute: 0.3 10*3/uL (ref 0.1–1.0)
Monocytes Relative: 7 %
Neutro Abs: 3.5 10*3/uL (ref 1.7–7.7)
Neutrophils Relative %: 69 %
Platelets: 146 10*3/uL — ABNORMAL LOW (ref 150–400)
RBC: 4.43 MIL/uL (ref 3.87–5.11)
RDW: 12.1 % (ref 11.5–15.5)
WBC: 5 10*3/uL (ref 4.0–10.5)
nRBC: 0 % (ref 0.0–0.2)

## 2022-01-26 LAB — CHLAMYDIA/NGC RT PCR (ARMC ONLY)
Chlamydia Tr: NOT DETECTED
N gonorrhoeae: NOT DETECTED

## 2022-01-26 LAB — RESP PANEL BY RT-PCR (FLU A&B, COVID) ARPGX2
Influenza A by PCR: NEGATIVE
Influenza B by PCR: NEGATIVE
SARS Coronavirus 2 by RT PCR: NEGATIVE

## 2022-01-26 LAB — PREGNANCY, URINE: Preg Test, Ur: NEGATIVE

## 2022-01-26 MED ORDER — DOXYCYCLINE HYCLATE 100 MG PO CAPS: 100.0000 mg | ORAL_CAPSULE | Freq: Two times a day (BID) | ORAL | 0 refills | Status: AC

## 2022-01-26 MED ORDER — OXYCODONE-ACETAMINOPHEN 5-325 MG PO TABS
1.0000 | ORAL_TABLET | Freq: Once | ORAL | Status: AC
  Administered 2022-01-26: 1 via ORAL
  Filled 2022-01-26: qty 1

## 2022-01-26 NOTE — ED Notes (Addendum)
Entered in error

## 2022-01-26 NOTE — ED Notes (Signed)
Patient provided with discharge instructions, script information and follow-up information. Patient verbalized understanding. Patient ambulated out to the waiting room with a steady gait.
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
## 2022-01-26 NOTE — ED Provider Notes (Signed)
[facility] Provider Note    Event Date/Time   First MD Initiated Contact with Patient 01/26/22 1546     (approximate)   History   Chief Complaint Wound Infection   HPI  Brooke Bauer is a 26 y.o. female with past medical history of anxiety and depression who presents to the ED complaining of wound infection.  Patient reports that for the past 3 to 4 days she has been dealing with a boil in the right upper quadrant of her abdomen.  She states that it was similar to previous boils and had started to drain on its own but then the middle portion turned black.  She reports some redness around this area but has not had any ongoing drainage.  She is now concerned because she has developed aching and stiffness in multiple joints.  She states this primarily affects both wrists but she has also begun to develop pain and stiffness in both hips and shoulders.  She has not noticed any redness or swelling in these joints, denies any fevers or chills.  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  She has no concerns for sexually transmitted infection and denies any vaginal bleeding or discharge.  She does state that she has been dealing with intermittent stiffness in her joints and has a family history of similar joint stiffness, however it has never been this severe.     Physical Exam   Triage Vital Signs: ED Triage Vitals  Enc Vitals Group     BP 01/26/22 1540 (!) 135/104     Pulse Rate 01/26/22 1540 (!) 107     Resp 01/26/22 1540 18     Temp 01/26/22 1540 98.7 F (37.1 C)     Temp src --      SpO2 01/26/22 1540 99 %     Weight 01/26/22 1532 220 lb 0.3 oz (99.8 kg)     Height 01/26/22 1532 5\' 5"  (1.651 m)     Head Circumference --      Peak Flow --      Pain Score 01/26/22 1532 8     Pain Loc --      Pain Edu? --      Excl. in GC? --     Most recent vital signs: Vitals:   01/26/22 1540  BP: (!)  135/104  Pulse: (!) 107  Resp: 18  Temp: 98.7 F (37.1 C)  SpO2: 99%    Constitutional: Alert and oriented. Eyes: Conjunctivae are normal. Head: Atraumatic. Nose: No congestion/rhinnorhea. Mouth/Throat: Mucous membranes are moist.  Cardiovascular: Normal rate, regular rhythm. Grossly normal heart sounds.  2+ radial pulses bilaterally. Respiratory: Normal respiratory effort.  No retractions. Lungs CTAB. Gastrointestinal: Soft and nontender. No distention.  Small area of scab to skin of right upper quadrant of abdomen with surrounding erythema and mild warmth with no focal fluctuance. Musculoskeletal: No lower extremity tenderness nor edema.  No erythema, edema, or warmth noted to extremity joints including bilateral shoulders, elbows, wrists, hips, knees, and ankles. Neurologic:  Normal speech and language. No gross focal neurologic deficits are appreciated.    ED Results / Procedures / Treatments   Labs (all labs ordered are listed, but only abnormal results are displayed) Labs Reviewed  CBC WITH DIFFERENTIAL/PLATELET - Abnormal; Notable for the following components:      Result Value   Platelets 146 (*)    All other components within  normal limits  COMPREHENSIVE METABOLIC PANEL - Abnormal; Notable for the following components:   Sodium 134 (*)    Glucose, Bld 100 (*)    Calcium 8.7 (*)    All other components within normal limits  URINALYSIS, ROUTINE W REFLEX MICROSCOPIC - Abnormal; Notable for the following components:   Color, Urine YELLOW (*)    APPearance HAZY (*)    Nitrite POSITIVE (*)    Bacteria, UA RARE (*)    All other components within normal limits  RESP PANEL BY RT-PCR (FLU A&B, COVID) ARPGX2  CHLAMYDIA/NGC RT PCR ([facility] ONLY)            CULTURE, BLOOD (ROUTINE X 2)  CULTURE, BLOOD (ROUTINE X 2)  PREGNANCY, URINE  POC URINE PREG, ED   RADIOLOGY Chest x-ray reviewed by me with no infiltrate, edema, or effusion.  PROCEDURES:  Critical Care performed:  No  Procedures   MEDICATIONS ORDERED IN ED: Medications  oxyCODONE-acetaminophen (PERCOCET/ROXICET) 5-325 MG per tablet 1 tablet (1 tablet Oral Given 01/26/22 1754)     IMPRESSION / MDM / ASSESSMENT AND PLAN / ED COURSE  I reviewed the triage vital signs and the nursing notes.                              26 y.o. female with past medical history of anxiety, depression, and methamphetamine use who presents to the ED complaining of abscess to the right upper quadrant of her abdomen developing over the past 3 to 4 days, now with diffuse joint aches and pains.  Differential diagnosis includes, but is not limited to, abscess, cellulitis, septic arthritis, gonococcal arthritis, rheumatoid arthritis, osteoarthritis.  Patient is nontoxic-appearing and in no acute distress, vital signs unremarkable for mild tachycardia but otherwise reassuring with no fever.  Patient has small area of cellulitis to the right upper quadrant of her abdomen however prior abscess appears to have completely drained and there is no ongoing fluctuance.  There is no abdominal tenderness outside of this area and clinically there is no evidence of septic arthritis.  Patient denies any history of IV drug use but does have history of polysubstance abuse and given her polyarthralgia we will send blood cultures along with CBC, CMP, lipase,, UA, and pregnancy testing.  We will also send testing for gonorrhea and chlamydia as this would be a potential source of her polyarthritis.  Labs are reassuring with no leukocytosis, anemia, electrolyte abnormality, or AKI.  LFTs are within normal limits and UA does not appear consistent with infection.  Testing for COVID-19 and influenza is negative, chest x-ray also unremarkable.  Blood cultures are pending at this time but suspicion for sepsis is very low given reassuring work-up.  Patient is appropriate for outpatient follow-up and was counseled to establish care with PCP, also provided with  referral to rheumatology for acute on chronic joint pains.  She is also requesting help with her anxiety and was provided with referral to [facility], was counseled to return to the ED for new worsening symptoms.  We will prescribe doxycycline for her abdominal wall cellulitis given previous purulent drainage, patient agrees with plan.      FINAL CLINICAL IMPRESSION(S) / ED DIAGNOSES   Final diagnoses:  Cellulitis of abdominal wall  Polyarthralgia  Anxiety     Rx / DC Orders   ED Discharge Orders          Ordered    doxycycline (VIBRAMYCIN) 100 MG  capsule  2 times daily        01/26/22 1917             Note:  This document was prepared using Dragon voice recognition software and may include unintentional dictation errors.   Chesley Noon, MD 01/26/22 Jerene Bears

## 2022-01-31 LAB — CULTURE, BLOOD (ROUTINE X 2)
Culture: NO GROWTH
Culture: NO GROWTH
Special Requests: ADEQUATE
Special Requests: ADEQUATE

## 2022-07-08 ENCOUNTER — Ambulatory Visit (INDEPENDENT_AMBULATORY_CARE_PROVIDER_SITE_OTHER): Payer: Medicaid Other | Admitting: Podiatry

## 2022-07-08 ENCOUNTER — Encounter: Payer: Self-pay | Admitting: Podiatry

## 2022-07-08 DIAGNOSIS — B351 Tinea unguium: Secondary | ICD-10-CM | POA: Diagnosis not present

## 2022-07-08 DIAGNOSIS — L600 Ingrowing nail: Secondary | ICD-10-CM

## 2022-07-08 NOTE — Progress Notes (Signed)
Subjective:   Patient ID: Brooke Bauer, female   DOB: 26 y.o.   MRN: [id number]   HPI Patient presents stating she has had chronic ingrown toenail right over left big toe and states that it is hard for her to wear shoe gear comfortably as it does not with but it is worried that it grows a bother her.  Patient does smoke a pack per day and is not currently active   Review of Systems  All other systems reviewed and are negative.       Objective:  Physical Exam Vitals and nursing note reviewed.  Constitutional:      Appearance: She is well-developed.  Pulmonary:     Effort: Pulmonary effort is normal.  Musculoskeletal:        General: Normal range of motion.  Skin:    General: Skin is warm.  Neurological:     Mental Status: She is alert.     Neurovascular status intact muscle strength adequate range of motion within normal limits with incurvation of the hallux nail right over left with some thickness in the middle but mostly painful in the medial lateral side.  No erythema edema drainage noted with good digital perfusion noted     Assessment:  Chronic ingrown toenail deformity right hallux over left hallux with also some thickness of the nailbed consistent with fungal infiltration     Plan:  H&P reviewed condition we will get a focus on correcting the right ingrown toenail and I did explain procedure risk and she wants to have this done.  I infiltrated the right hallux 60 mg like Marcaine mixture sterile prep done using sterile instrumentation I remove the nail borders after she read then signed consent form understanding risk.  I then applied sterile dressing advised to leave it on 24 hours but take it off earlier if throbbing were to occur answered questions encouraged to call us to come in and may require left 1 to be done in future

## 2022-08-21 ENCOUNTER — Ambulatory Visit: Payer: Self-pay | Admitting: *Deleted

## 2022-08-21 NOTE — Telephone Encounter (Signed)
  Chief Complaint: elevated BP Symptoms: some dizziness, not feeling good, elevated BP and P readings Frequency: possibly chronic Pertinent Negatives: Patient denies   Disposition: [] ED /[x] Urgent Care (no appt availability in office) / [] Appointment(In office/virtual)/ []  [place] [facility]/ [] Home Care/ [] Refused Recommended Disposition /[] [place] [facility]/ []  Follow-up with PCP Additional Notes: Patient has NP appointment in future- advised UC/mobile unit for evaluation before NP appointment    Reason for Disposition  Systolic BP  >= 180 OR Diastolic >= 110  Answer Assessment - Initial Assessment Questions 1. BLOOD PRESSURE: "What is the blood pressure?" "Did you take at least two measurements 5 minutes apart?"     168/122-2 days ago, 148/115 P 110 2. ONSET: "When did you take your blood pressure?"     This morning 3. HOW: "How did you take your blood pressure?" (e.g., automatic home BP monitor, visiting nurse)     Automatic- arm  4. HISTORY: "Do you have a history of high blood pressure?"     no 5. MEDICINES: "Are you taking any medicines for blood pressure?" "Have you missed any doses recently?"     no 6. OTHER SYMPTOMS: "Do you have any symptoms?" (e.g., blurred vision, chest pain, difficulty breathing, headache, weakness)     Dizzy spell 7. PREGNANCY: "Is there any chance you are pregnant?" "When was your last menstrual period?"     No- LMP- last week  Protocols used: Blood Pressure - High-A-AH

## 2022-08-23 DIAGNOSIS — F419 Anxiety disorder, unspecified: Secondary | ICD-10-CM | POA: Insufficient documentation

## 2022-09-23 ENCOUNTER — Ambulatory Visit: Payer: Medicaid Other | Admitting: Physician Assistant

## 2022-11-21 DIAGNOSIS — F1910 Other psychoactive substance abuse, uncomplicated: Secondary | ICD-10-CM | POA: Insufficient documentation

## 2022-11-21 DIAGNOSIS — I10 Essential (primary) hypertension: Secondary | ICD-10-CM | POA: Insufficient documentation

## 2023-12-17 NOTE — L&D Delivery Note (Signed)
 Delivery Note At 6:45 PM a viable female was delivered via Vaginal, Spontaneous (Presentation:   VTX / LOA   ).  APGAR: 7, 9; weight 7 lb 12.5 oz (3530 g).   Placenta status:  intact,  .  Cord:   with the following complications: none .  Cord pH: n/a   Pt progressed to c/c and strong urge to push . 15 minutes of pushing yielded a  Head . Shoulders and body delivered without complication . Vigorous female dried with nursery staff in attendance . Delayed cord clamping 60 sec. Placenta delivered shortly after . Good hemostasis. Small first degree laceration repaired 2 fig 8  000 vicryl  Anesthesia:  IV fentanyl   Episiotomy:  none Lacerations: first degree   Suture Repair: 3.0 vicryl Est. Blood Loss (mL):  100 cc   No complications  Mom to postpartum.  Baby to Couplet care / Skin to Skin.  Debby PARAS Honestii Marton 11/14/2024, 7:03 PM

## 2024-05-06 DIAGNOSIS — O0991 Supervision of high risk pregnancy, unspecified, first trimester: Secondary | ICD-10-CM | POA: Insufficient documentation

## 2024-05-28 LAB — OB RESULTS CONSOLE RUBELLA ANTIBODY, IGM: Rubella: IMMUNE

## 2024-05-28 LAB — HEPATITIS C ANTIBODY: HCV Ab: NEGATIVE

## 2024-05-28 LAB — OB RESULTS CONSOLE HEPATITIS B SURFACE ANTIGEN: Hepatitis B Surface Ag: NEGATIVE

## 2024-06-14 ENCOUNTER — Other Ambulatory Visit: Payer: Self-pay | Admitting: Certified Nurse Midwife

## 2024-06-14 DIAGNOSIS — O0991 Supervision of high risk pregnancy, unspecified, first trimester: Secondary | ICD-10-CM

## 2024-06-14 DIAGNOSIS — Z148 Genetic carrier of other disease: Secondary | ICD-10-CM

## 2024-06-14 DIAGNOSIS — O99210 Obesity complicating pregnancy, unspecified trimester: Secondary | ICD-10-CM

## 2024-07-08 NOTE — Progress Notes (Signed)
  [facility] - OBSTETRICS AND GYNECOLOGY   Routine Prenatal Care Visit  Subjective  Brooke B[facility] is a 28 y.o. G2P1001 at [redacted]w[redacted]d being seen today for ongoing prenatal care.  She is currently monitored for tthis high-risk pregnancy.  ----------------------------------------------------------------------------------- Patient reports no issues.    .  .  Movement: Present. Leaking Fluid denies.  Vaginal Bleeding: denies ----------------------------------------------------------------------------------- The following portions of the patient's history were reviewed and updated as appropriate: allergies, current medications, past family history, past medical history, past social history, past surgical history and problem list. Problem list updated.  Objective  BP 133/82   Pulse 109   Ht 165.1 cm (5' 5)   Wt (!) 112.9 kg (249 lb)   LMP 02/20/2024 (Approximate)   BMI 41.44 kg/m   Pregravid weight Pregravid weight not on file Total Weight Gain Not found. Urinalysis: Urine Protein    Urine Glucose    Fetal Status: Fetal Heart Rate: 155   Movement: Present     General:  Alert, oriented and cooperative. Patient is in no acute distress.  Skin: Skin is warm and dry. No rash noted.   Cardiovascular: Normal heart rate noted  Respiratory: Normal respiratory effort, no problems with respiration noted  Abdomen: Soft, gravid, appropriate for gestational age.       Pelvic:  Cervical exam deferred        Extremities: Normal range of motion.     Mental Status: Normal mood and affect. Normal behavior. Normal judgment and thought content.   Assessment   28 y.o. G2P1001 at [redacted]w[redacted]d by  11/26/2024, by Last Menstrual Period presenting for routine prenatal visit  Plan    Preterm labor symptoms and general obstetric precautions including but not limited to vaginal bleeding, contractions, leaking of fluid and fetal movement were reviewed in detail with the patient. Please refer to After Visit  Summary for other counseling recommendations.   Return in about 4 weeks (around 08/05/2024) for Routine prenatal.   Attestation Statement:   I personally performed the service. (TP)  STEPHEN TORIBIO MACE, MD  [facility] 07/08/2024 10:01 AM

## 2024-07-14 ENCOUNTER — Other Ambulatory Visit

## 2024-07-14 ENCOUNTER — Ambulatory Visit

## 2024-07-14 ENCOUNTER — Encounter

## 2024-07-14 ENCOUNTER — Ambulatory Visit: Attending: Certified Nurse Midwife

## 2024-07-14 ENCOUNTER — Ambulatory Visit (HOSPITAL_BASED_OUTPATIENT_CLINIC_OR_DEPARTMENT_OTHER): Admitting: Obstetrics and Gynecology

## 2024-07-14 VITALS — BP 135/72 | HR 97

## 2024-07-14 DIAGNOSIS — F17210 Nicotine dependence, cigarettes, uncomplicated: Secondary | ICD-10-CM | POA: Insufficient documentation

## 2024-07-14 DIAGNOSIS — O99332 Smoking (tobacco) complicating pregnancy, second trimester: Secondary | ICD-10-CM

## 2024-07-14 DIAGNOSIS — E6689 Other obesity not elsewhere classified: Secondary | ICD-10-CM

## 2024-07-14 DIAGNOSIS — O99210 Obesity complicating pregnancy, unspecified trimester: Secondary | ICD-10-CM | POA: Insufficient documentation

## 2024-07-14 DIAGNOSIS — O0991 Supervision of high risk pregnancy, unspecified, first trimester: Secondary | ICD-10-CM | POA: Insufficient documentation

## 2024-07-14 DIAGNOSIS — O99342 Other mental disorders complicating pregnancy, second trimester: Secondary | ICD-10-CM | POA: Diagnosis not present

## 2024-07-14 DIAGNOSIS — O99412 Diseases of the circulatory system complicating pregnancy, second trimester: Secondary | ICD-10-CM | POA: Diagnosis not present

## 2024-07-14 DIAGNOSIS — E669 Obesity, unspecified: Secondary | ICD-10-CM

## 2024-07-14 DIAGNOSIS — Z148 Genetic carrier of other disease: Secondary | ICD-10-CM | POA: Insufficient documentation

## 2024-07-14 DIAGNOSIS — O10012 Pre-existing essential hypertension complicating pregnancy, second trimester: Secondary | ICD-10-CM | POA: Diagnosis present

## 2024-07-14 DIAGNOSIS — O99212 Obesity complicating pregnancy, second trimester: Secondary | ICD-10-CM | POA: Diagnosis present

## 2024-07-14 DIAGNOSIS — Z363 Encounter for antenatal screening for malformations: Secondary | ICD-10-CM | POA: Insufficient documentation

## 2024-07-14 DIAGNOSIS — Z3A20 20 weeks gestation of pregnancy: Secondary | ICD-10-CM

## 2024-07-14 DIAGNOSIS — I10 Essential (primary) hypertension: Secondary | ICD-10-CM | POA: Diagnosis not present

## 2024-07-14 DIAGNOSIS — Z7982 Long term (current) use of aspirin: Secondary | ICD-10-CM | POA: Insufficient documentation

## 2024-07-14 DIAGNOSIS — Z8489 Family history of other specified conditions: Secondary | ICD-10-CM | POA: Insufficient documentation

## 2024-07-14 NOTE — Progress Notes (Signed)
 Maternal-Fetal Medicine Consultation Name: Brooke [facility] MRN: [id number]  G2 P1001 at 20w 5d gestation.  Patient is here for fetal anatomy scan.  Her high risk problems include: - Chronic hypertension.  Well-controlled without antihypertensives.  Blood pressure today at our office is 135/72 mmHg.  Patient takes low-dose aspirin prophylaxis.  Maternal echocardiography was reported as normal. - Pregravid BMI 41. - Cigarette smoking. - Family history of genetic disorder ("brittle-bone disease").  Patient has an appointment to meet with our genetic counselor next week.  On cell free fetal DNA screening, the risks of fetal aneuploidies are not increased.  Obstetric history significant for a term vaginal delivery in 2017 of a female infant weighing 6 pounds and 3 ounces at birth.  Ultrasound We performed a fetal anatomical survey.  Amniotic fluid is normal good fetal activity seen.  Fetal biometry is consistent with the previously established dates.  No markers of aneuploidies or obvious fetal structural defects are seen.  Patient understands the limitations of ultrasound in detecting fetal anomalies.  Chronic hypertension I discussed possible adverse outcomes of severe hypertension including fetal growth restriction, placental abruption and maternal complications of stroke, renal failure and coagulopathy.  I discussed the safety profiles of antihypertensives used in pregnancy.  I encouraged her to continue low-dose aspirin prophylaxis that delays or prevents preeclampsia. I counseled the patient on ultrasound protocol in pregnancy is complicated with hypertension.  Pregravid BMI 41 Grade 3 obesity is independently associated with increased risk of stillbirth (2.5- to 3-fold), but the absolute risk is very small.  I discussed protocol of weekly antenatal testing from [redacted] weeks gestation until delivery. In addition, there are limitations in the resolution of ultrasound images and fetal anomalies may be  missed.  Weight loss is not advised in pregnancy and a weight gain of 11 to 20 pounds is reasonable. Cigarette smoking I discussed the nicotine  replacement therapy that is shown to improve quit rates.  Cigarette smoking is associated with increased risk of placental abruption and fetal growth restriction.  Recommendations - Follow-up ultrasound may be performed at your office. - Fetal growth assessments every 4 weeks till delivery. - Weekly antenatal testing from [redacted] weeks gestation until delivery. - Delivery at 49- or 39-weeks' gestation provide at hypertension well-controlled.    Consultation including face-to-face (more than 50%) counseling 30 minutes.

## 2024-07-19 ENCOUNTER — Ambulatory Visit

## 2024-07-19 ENCOUNTER — Encounter: Payer: Self-pay | Admitting: Obstetrics and Gynecology

## 2024-09-28 LAB — OB RESULTS CONSOLE HIV ANTIBODY (ROUTINE TESTING): HIV: NONREACTIVE

## 2024-10-26 ENCOUNTER — Other Ambulatory Visit: Payer: Self-pay | Admitting: Obstetrics and Gynecology

## 2024-10-26 DIAGNOSIS — O10919 Unspecified pre-existing hypertension complicating pregnancy, unspecified trimester: Secondary | ICD-10-CM

## 2024-10-26 LAB — OB RESULTS CONSOLE GBS: GBS: NEGATIVE

## 2024-10-26 NOTE — Progress Notes (Unsigned)
 Induction 11/14/24 at 0001   Chronic hypertension

## 2024-11-09 ENCOUNTER — Other Ambulatory Visit: Payer: Self-pay

## 2024-11-09 DIAGNOSIS — O10919 Unspecified pre-existing hypertension complicating pregnancy, unspecified trimester: Secondary | ICD-10-CM

## 2024-11-12 ENCOUNTER — Observation Stay
Admission: AD | Admit: 2024-11-12 | Discharge: 2024-11-12 | Disposition: A | Discharge status: Home / Self Care | Source: Ambulatory Visit | Attending: Obstetrics and Gynecology | Admitting: Obstetrics and Gynecology

## 2024-11-12 ENCOUNTER — Inpatient Hospital Stay

## 2024-11-12 ENCOUNTER — Encounter: Payer: Self-pay | Admitting: Obstetrics and Gynecology

## 2024-11-12 DIAGNOSIS — F17210 Nicotine dependence, cigarettes, uncomplicated: Secondary | ICD-10-CM | POA: Insufficient documentation

## 2024-11-12 DIAGNOSIS — Z3A38 38 weeks gestation of pregnancy: Secondary | ICD-10-CM | POA: Insufficient documentation

## 2024-11-12 DIAGNOSIS — Z7982 Long term (current) use of aspirin: Secondary | ICD-10-CM | POA: Diagnosis not present

## 2024-11-12 DIAGNOSIS — O99333 Smoking (tobacco) complicating pregnancy, third trimester: Secondary | ICD-10-CM | POA: Diagnosis not present

## 2024-11-12 DIAGNOSIS — O133 Gestational [pregnancy-induced] hypertension without significant proteinuria, third trimester: Secondary | ICD-10-CM | POA: Diagnosis not present

## 2024-11-12 DIAGNOSIS — O99213 Obesity complicating pregnancy, third trimester: Secondary | ICD-10-CM | POA: Diagnosis not present

## 2024-11-12 DIAGNOSIS — O0993 Supervision of high risk pregnancy, unspecified, third trimester: Secondary | ICD-10-CM | POA: Diagnosis not present

## 2024-11-12 DIAGNOSIS — O403XX0 Polyhydramnios, third trimester, not applicable or unspecified: Secondary | ICD-10-CM | POA: Diagnosis not present

## 2024-11-12 DIAGNOSIS — E669 Obesity, unspecified: Secondary | ICD-10-CM | POA: Diagnosis not present

## 2024-11-12 DIAGNOSIS — O368330 Maternal care for abnormalities of the fetal heart rate or rhythm, third trimester, not applicable or unspecified: Secondary | ICD-10-CM | POA: Diagnosis present

## 2024-11-12 DIAGNOSIS — O119 Pre-existing hypertension with pre-eclampsia, unspecified trimester: Principal | ICD-10-CM | POA: Diagnosis present

## 2024-11-12 DIAGNOSIS — O10919 Unspecified pre-existing hypertension complicating pregnancy, unspecified trimester: Principal | ICD-10-CM | POA: Diagnosis present

## 2024-11-12 MED ORDER — ACETAMINOPHEN 500 MG PO TABS: 1000.0000 mg | ORAL_TABLET | Freq: Four times a day (QID) | ORAL | Status: DC | PRN

## 2024-11-12 MED ORDER — CALCIUM CARBONATE ANTACID 500 MG PO CHEW: 2.0000 | CHEWABLE_TABLET | ORAL | Status: DC | PRN

## 2024-11-12 NOTE — OB Triage Note (Signed)
 G2P1 for scheduled NST and BPP d/t GHTN

## 2024-11-12 NOTE — Discharge Summary (Signed)
 Brooke Bauer is a 28 y.o. female. She is at [redacted]w[redacted]d gestation. Patient's last menstrual period was 02/20/2024. Estimated Date of Delivery: 11/26/24  Prenatal care site: [facility]   Current pregnancy complicated by:  - polyhydramnios - obesity - cHTN  Chief complaint: here for NST and BPP  Denies concerns. Here for NST and BPP since office closed today.  S: Resting comfortably. no CTX, no VB.no LOF,  Active fetal movement.  Denies: HA, visual changes, SOB, or RUQ/epigastric pain  Maternal Medical History:   Past Medical History:  Diagnosis Date   Anxiety    Depression    no meds x 5 years   Hypertension     Past Surgical History:  Procedure Laterality Date   APPENDECTOMY      No Known Allergies  Prior to Admission medications   Medication Sig Start Date End Date Taking? Authorizing Provider  aspirin EC 81 MG tablet Take 81 mg by mouth daily. Swallow whole.    [provider]  Prenatal Vit-Fe Fumarate-FA (PRENATAL MULTIVITAMIN) TABS tablet Take 1 tablet by mouth daily at 12 noon.    [provider]  QUEtiapine (SEROQUEL) 25 MG tablet Take 25-50 mg by mouth at bedtime as needed. Patient not taking: Reported on 07/14/2024 06/17/22   [provider]      Social History: She  reports that she has been smoking cigarettes. She has never used smokeless tobacco. She reports that she does not drink alcohol and does not use drugs.  Family History: family history includes Hypertension in her father, maternal grandfather, and mother.  no history of gyn cancers  Review of Systems: A full review of systems was performed and negative except as noted in the HPI.     O:  BP 129/71   Pulse 91   Temp 98.3 F (36.8 C) (Oral)   Resp (!) 21   LMP 02/20/2024  No results found for this or any previous visit (from the past 48 hours).   Constitutional: NAD, AAOx3  HE/ENT: extraocular movements grossly intact, moist mucous membranes CV: RRR PULM:  nl respiratory effort, CTABL     Abd: gravid, non-tender, non-distended, soft      Ext: Non-tender, Nonedematous   Psych: mood appropriate, speech normal Pelvic: deferred  Fetal  monitoring: Cat 1 Appropriate for GA Baseline: 130 Variability: moderate Accelerations: present x >2 Decelerations absent Time  CLINICAL DATA:  [id number] Chronic hypertension during pregnancy [id number] Obesity in pregnancy [id number] Polyhydramnios affecting pregnancy [id number]   EXAM: BIOPHYSICAL PROFILE   FINDINGS: Number of Fetuses: 1   Heart Rate: 136 bpm   Presentation: Cephalic   Movement: 2      Time: 11 minutes   Breathing: 2   Tone: 2   Amniotic Fluid: 2   Total Score: 8/8   IMPRESSION: Normal biophysical profile with a score of 8 out of 8. Fetal heart rate of 136 beats per minute.    A/P: 28 y.o. [redacted]w[redacted]d here for antenatal surveillance for NST and BPP  Principle Diagnosis:  High risk pregnancy in third trimester  Labor: not present.  Fetal Wellbeing: Reassuring Cat 1 tracing. BPP 8/8. Reactive NST  D/c home stable, precautions reviewed, follow-up as scheduled.    Edsel Charlies Blush, CNM 11/12/2024 8:51 PM

## 2024-11-14 ENCOUNTER — Encounter: Payer: Self-pay | Admitting: Anesthesiology

## 2024-11-14 ENCOUNTER — Encounter: Payer: Self-pay | Admitting: Obstetrics and Gynecology

## 2024-11-14 ENCOUNTER — Inpatient Hospital Stay
Admission: EM | Admit: 2024-11-14 | Discharge: 2024-11-16 | DRG: 806 | Disposition: A | Attending: Obstetrics and Gynecology | Admitting: Obstetrics and Gynecology

## 2024-11-14 DIAGNOSIS — F17210 Nicotine dependence, cigarettes, uncomplicated: Secondary | ICD-10-CM | POA: Diagnosis present

## 2024-11-14 DIAGNOSIS — Z3A38 38 weeks gestation of pregnancy: Secondary | ICD-10-CM | POA: Diagnosis not present

## 2024-11-14 DIAGNOSIS — Z8249 Family history of ischemic heart disease and other diseases of the circulatory system: Secondary | ICD-10-CM

## 2024-11-14 DIAGNOSIS — O1092 Unspecified pre-existing hypertension complicating childbirth: Secondary | ICD-10-CM | POA: Diagnosis present

## 2024-11-14 DIAGNOSIS — O9081 Anemia of the puerperium: Secondary | ICD-10-CM | POA: Diagnosis not present

## 2024-11-14 DIAGNOSIS — O99210 Obesity complicating pregnancy, unspecified trimester: Secondary | ICD-10-CM | POA: Diagnosis present

## 2024-11-14 DIAGNOSIS — O114 Pre-existing hypertension with pre-eclampsia, complicating childbirth: Secondary | ICD-10-CM | POA: Diagnosis present

## 2024-11-14 DIAGNOSIS — O139 Gestational [pregnancy-induced] hypertension without significant proteinuria, unspecified trimester: Principal | ICD-10-CM | POA: Diagnosis present

## 2024-11-14 DIAGNOSIS — O119 Pre-existing hypertension with pre-eclampsia, unspecified trimester: Secondary | ICD-10-CM | POA: Diagnosis present

## 2024-11-14 DIAGNOSIS — O99214 Obesity complicating childbirth: Secondary | ICD-10-CM | POA: Diagnosis present

## 2024-11-14 DIAGNOSIS — O99334 Smoking (tobacco) complicating childbirth: Secondary | ICD-10-CM | POA: Diagnosis present

## 2024-11-14 DIAGNOSIS — D62 Acute posthemorrhagic anemia: Secondary | ICD-10-CM | POA: Diagnosis not present

## 2024-11-14 HISTORY — DX: Carpal tunnel syndrome, unspecified upper limb: G56.00

## 2024-11-14 LAB — PROTEIN / CREATININE RATIO, URINE
Creatinine, Urine: 36 mg/dL
Protein Creatinine Ratio: 0.49 mg/mg{creat} — ABNORMAL HIGH (ref 0.00–0.15)
Total Protein, Urine: 18 mg/dL

## 2024-11-14 LAB — TYPE AND SCREEN
ABO/RH(D): A POS
Antibody Screen: NEGATIVE

## 2024-11-14 LAB — COMPREHENSIVE METABOLIC PANEL WITH GFR
ALT: 11 U/L (ref 0–44)
AST: 17 U/L (ref 15–41)
Albumin: 3.2 g/dL — ABNORMAL LOW (ref 3.5–5.0)
Alkaline Phosphatase: 187 U/L — ABNORMAL HIGH (ref 38–126)
Anion gap: 11 (ref 5–15)
BUN: 6 mg/dL (ref 6–20)
CO2: 20 mmol/L — ABNORMAL LOW (ref 22–32)
Calcium: 9 mg/dL (ref 8.9–10.3)
Chloride: 103 mmol/L (ref 98–111)
Creatinine, Ser: 0.42 mg/dL — ABNORMAL LOW (ref 0.44–1.00)
GFR, Estimated: >60 mL/min (ref >60)
Glucose, Bld: 89 mg/dL (ref 70–99)
Potassium: 4 mmol/L (ref 3.5–5.1)
Sodium: 135 mmol/L (ref 135–145)
Total Bilirubin: <0.2 mg/dL (ref 0.0–1.2)
Total Protein: 6.4 g/dL — ABNORMAL LOW (ref 6.5–8.1)

## 2024-11-14 LAB — CBC
HCT: 32.5 % — ABNORMAL LOW (ref 36.0–46.0)
Hemoglobin: 10.7 g/dL — ABNORMAL LOW (ref 12.0–15.0)
MCH: 29.2 pg (ref 26.0–34.0)
MCHC: 32.9 g/dL (ref 30.0–36.0)
MCV: 88.6 fL (ref 80.0–100.0)
Platelets: 165 K/uL (ref 150–400)
RBC: 3.67 MIL/uL — ABNORMAL LOW (ref 3.87–5.11)
RDW: 13.6 % (ref 11.5–15.5)
WBC: 11.1 K/uL — ABNORMAL HIGH (ref 4.0–10.5)
nRBC: 0 % (ref 0.0–0.2)

## 2024-11-14 MED ORDER — OXYCODONE HCL 5 MG PO TABS
5.0000 mg | ORAL_TABLET | ORAL | Status: DC | PRN
  Administered 2024-11-16 (×2): 5 mg via ORAL
  Filled 2024-11-14 (×2): qty 1

## 2024-11-14 MED ORDER — IBUPROFEN 600 MG PO TABS
600.0000 mg | ORAL_TABLET | Freq: Four times a day (QID) | ORAL | Status: DC
  Administered 2024-11-14: 600 mg via ORAL
  Filled 2024-11-14: qty 1

## 2024-11-14 MED ORDER — MAGNESIUM HYDROXIDE 400 MG/5ML PO SUSP: 30.0000 mL | ORAL | Status: DC | PRN

## 2024-11-14 MED ORDER — IBUPROFEN 600 MG PO TABS
600.0000 mg | ORAL_TABLET | Freq: Four times a day (QID) | ORAL | Status: DC
  Administered 2024-11-15 – 2024-11-16 (×6): 600 mg via ORAL
  Filled 2024-11-14 (×6): qty 1

## 2024-11-14 MED ORDER — ONDANSETRON HCL 4 MG/2ML IJ SOLN: 4.0000 mg | INTRAMUSCULAR | Status: DC | PRN

## 2024-11-14 MED ORDER — FENTANYL-BUPIVACAINE-NACL 0.5-0.125-0.9 MG/250ML-% EP SOLN: 12.0000 mL/h | EPIDURAL | Status: DC | PRN

## 2024-11-14 MED ORDER — DIPHENHYDRAMINE HCL 25 MG PO CAPS: 25.0000 mg | ORAL_CAPSULE | Freq: Four times a day (QID) | ORAL | Status: DC | PRN

## 2024-11-14 MED ORDER — MISOPROSTOL 50MCG HALF TABLET
50.0000 ug | ORAL_TABLET | ORAL | Status: DC
  Administered 2024-11-14: 50 ug via ORAL
  Filled 2024-11-14: qty 1

## 2024-11-14 MED ORDER — ACETAMINOPHEN 325 MG PO TABS: 650.0000 mg | ORAL_TABLET | ORAL | Status: DC | PRN

## 2024-11-14 MED ORDER — EPHEDRINE 5 MG/ML INJ: 10.0000 mg | INTRAVENOUS | Status: DC | PRN

## 2024-11-14 MED ORDER — ONDANSETRON HCL 4 MG/2ML IJ SOLN: 4.0000 mg | Freq: Four times a day (QID) | INTRAMUSCULAR | Status: DC | PRN

## 2024-11-14 MED ORDER — OXYCODONE HCL 5 MG PO TABS: 10.0000 mg | ORAL_TABLET | ORAL | Status: DC | PRN

## 2024-11-14 MED ORDER — FERROUS SULFATE 325 (65 FE) MG PO TABS
325.0000 mg | ORAL_TABLET | Freq: Two times a day (BID) | ORAL | Status: DC
  Administered 2024-11-15 – 2024-11-16 (×3): 325 mg via ORAL
  Filled 2024-11-14 (×4): qty 1

## 2024-11-14 MED ORDER — SOD CITRATE-CITRIC ACID 500-334 MG/5ML PO SOLN: 30.0000 mL | ORAL | Status: DC | PRN

## 2024-11-14 MED ORDER — DIBUCAINE (PERIANAL) 1 % EX OINT: 1.0000 | TOPICAL_OINTMENT | CUTANEOUS | Status: DC | PRN

## 2024-11-14 MED ORDER — SIMETHICONE 80 MG PO CHEW: 80.0000 mg | CHEWABLE_TABLET | ORAL | Status: DC | PRN

## 2024-11-14 MED ORDER — PHENYLEPHRINE 80 MCG/ML (10ML) SYRINGE FOR IV PUSH (FOR BLOOD PRESSURE SUPPORT): 80.0000 ug | PREFILLED_SYRINGE | INTRAVENOUS | Status: DC | PRN

## 2024-11-14 MED ORDER — WITCH HAZEL-GLYCERIN EX PADS
1.0000 | MEDICATED_PAD | CUTANEOUS | Status: DC | PRN
  Administered 2024-11-14: 1 via TOPICAL
  Filled 2024-11-14: qty 100

## 2024-11-14 MED ORDER — SENNOSIDES-DOCUSATE SODIUM 8.6-50 MG PO TABS
2.0000 | ORAL_TABLET | ORAL | Status: DC
  Administered 2024-11-14 – 2024-11-15 (×2): 2 via ORAL
  Filled 2024-11-14 (×2): qty 2

## 2024-11-14 MED ORDER — MEASLES, MUMPS & RUBELLA VAC ~~LOC~~ SUSR
0.5000 mL | Freq: Once | SUBCUTANEOUS | Status: DC
  Filled 2024-11-14: qty 0.5

## 2024-11-14 MED ORDER — MISOPROSTOL 25 MCG QUARTER TABLET
ORAL_TABLET | ORAL | Status: DC
  Filled 2024-11-14: qty 1

## 2024-11-14 MED ORDER — OXYTOCIN-SODIUM CHLORIDE 30-0.9 UT/500ML-% IV SOLN
1.0000 m[IU]/min | INTRAVENOUS | Status: DC
  Administered 2024-11-14: 4 m[IU]/min via INTRAVENOUS
  Filled 2024-11-14: qty 500

## 2024-11-14 MED ORDER — FENTANYL CITRATE (PF) 100 MCG/2ML IJ SOLN
50.0000 ug | INTRAMUSCULAR | Status: DC | PRN
  Administered 2024-11-14 (×2): 100 ug via INTRAVENOUS
  Filled 2024-11-14: qty 2

## 2024-11-14 MED ORDER — PRENATAL MULTIVITAMIN CH
1.0000 | ORAL_TABLET | Freq: Every day | ORAL | Status: DC
  Administered 2024-11-15 – 2024-11-16 (×2): 1 via ORAL
  Filled 2024-11-14 (×3): qty 1

## 2024-11-14 MED ORDER — TETANUS-DIPHTH-ACELL PERTUSSIS 5-2-15.5 LF-MCG/0.5 IM SUSP
0.5000 mL | Freq: Once | INTRAMUSCULAR | Status: DC
  Filled 2024-11-14: qty 0.5

## 2024-11-14 MED ORDER — LACTATED RINGERS IV SOLN: 500.0000 mL | INTRAVENOUS | Status: DC | PRN

## 2024-11-14 MED ORDER — OXYTOCIN BOLUS FROM INFUSION
333.0000 mL | Freq: Once | INTRAVENOUS | Status: AC
  Administered 2024-11-14: 333 mL via INTRAVENOUS

## 2024-11-14 MED ORDER — BENZOCAINE-MENTHOL 20-0.5 % EX AERO
1.0000 | INHALATION_SPRAY | CUTANEOUS | Status: DC | PRN
  Administered 2024-11-14: 1 via TOPICAL
  Filled 2024-11-14: qty 56

## 2024-11-14 MED ORDER — LACTATED RINGERS IV SOLN: INTRAVENOUS | Status: DC

## 2024-11-14 MED ORDER — DIPHENHYDRAMINE HCL 50 MG/ML IJ SOLN: 12.5000 mg | INTRAMUSCULAR | Status: DC | PRN

## 2024-11-14 MED ORDER — TERBUTALINE SULFATE 1 MG/ML IJ SOLN: 0.2500 mg | Freq: Once | INTRAMUSCULAR | Status: DC | PRN

## 2024-11-14 MED ORDER — DIBUCAINE (PERIANAL) 1 % EX OINT
1.0000 | TOPICAL_OINTMENT | CUTANEOUS | Status: DC | PRN
  Administered 2024-11-14: 1 via RECTAL
  Filled 2024-11-14: qty 28

## 2024-11-14 MED ORDER — MISOPROSTOL 50MCG HALF TABLET
50.0000 ug | ORAL_TABLET | ORAL | Status: DC
  Administered 2024-11-14: 50 ug via VAGINAL
  Filled 2024-11-14: qty 1

## 2024-11-14 MED ORDER — COCONUT OIL OIL: 1.0000 | TOPICAL_OIL | Status: DC | PRN

## 2024-11-14 MED ORDER — ZOLPIDEM TARTRATE 5 MG PO TABS: 5.0000 mg | ORAL_TABLET | Freq: Every evening | ORAL | Status: DC | PRN

## 2024-11-14 MED ORDER — ONDANSETRON HCL 4 MG PO TABS: 4.0000 mg | ORAL_TABLET | ORAL | Status: DC | PRN

## 2024-11-14 MED ORDER — BENZOCAINE-MENTHOL 20-0.5 % EX AERO: 1.0000 | INHALATION_SPRAY | CUTANEOUS | Status: DC | PRN

## 2024-11-14 MED ORDER — ACETAMINOPHEN 325 MG PO TABS
650.0000 mg | ORAL_TABLET | ORAL | Status: DC | PRN
  Administered 2024-11-14: 650 mg via ORAL
  Filled 2024-11-14: qty 2

## 2024-11-14 MED ORDER — ACETAMINOPHEN 325 MG PO TABS
650.0000 mg | ORAL_TABLET | ORAL | Status: DC | PRN
  Administered 2024-11-16: 650 mg via ORAL
  Filled 2024-11-14: qty 2

## 2024-11-14 MED ORDER — LABETALOL HCL 100 MG PO TABS: 200.0000 mg | ORAL_TABLET | Freq: Two times a day (BID) | ORAL | Status: DC

## 2024-11-14 MED ORDER — FENTANYL CITRATE (PF) 100 MCG/2ML IJ SOLN
INTRAMUSCULAR | Status: AC
  Filled 2024-11-14: qty 2

## 2024-11-14 MED ORDER — HYDROCODONE-ACETAMINOPHEN 5-325 MG PO TABS
1.0000 | ORAL_TABLET | ORAL | Status: DC | PRN
  Filled 2024-11-14: qty 1

## 2024-11-14 MED ORDER — LIDOCAINE HCL (PF) 1 % IJ SOLN
30.0000 mL | INTRAMUSCULAR | Status: AC | PRN
  Administered 2024-11-14: 30 mL via SUBCUTANEOUS
  Filled 2024-11-14: qty 30

## 2024-11-14 MED ORDER — LACTATED RINGERS IV SOLN: 500.0000 mL | Freq: Once | INTRAVENOUS | Status: DC

## 2024-11-14 MED ORDER — WITCH HAZEL-GLYCERIN EX PADS: 1.0000 | MEDICATED_PAD | CUTANEOUS | Status: DC | PRN

## 2024-11-14 MED ORDER — OXYTOCIN-SODIUM CHLORIDE 30-0.9 UT/500ML-% IV SOLN: 2.5000 [IU]/h | INTRAVENOUS | Status: DC

## 2024-11-14 NOTE — Discharge Summary (Signed)
 Postpartum Discharge Summary  Patient Name: Brooke [facility] DOB: [birth year] MRN: [id number]  Date of admission: 11/14/2024 Delivery date:11/14/2024 Delivering provider: LOVETTA DEBBY PARAS Date of discharge: 11/16/2024  Primary OB: [facility] OFE:Ejupzwu'd last menstrual period was 02/20/2024. EDC Estimated Date of Delivery: 11/26/24 Gestational Age at Delivery: [redacted]w[redacted]d   Admitting diagnosis: Gestational hypertension [O13.9] Intrauterine pregnancy: [redacted]w[redacted]d     Secondary diagnosis:   Principal Problem:   Gestational hypertension Active Problems:   Chronic hypertension with superimposed preeclampsia   Obesity in pregnancy   Discharge Diagnosis: Term Pregnancy Delivered and CHTN with superimposed preeclampsia      Hospital course: Induction of Labor With Vaginal Delivery   28 y.o. yo G2P2002 at [redacted]w[redacted]d was admitted to the hospital 11/14/2024 for induction of labor.  Indication for induction: chronic hypertension with superimposed preeclampsia.  Patient had an uncomplicated labor course. Membrane Rupture Time/Date: 1:29 PM,11/14/2024  Delivery Method:Vaginal, Spontaneous Operative Delivery:N/A Episiotomy: None Lacerations:  1st degree Details of delivery can be found in separate delivery note.  Patient had a postpartum course complicated by hypertension. Blood pressures were controlled with labetalol. Patient is discharged home 11/16/24.  Newborn Data: Birth date:11/14/2024 Birth time:6:45 PM Gender:Female Living status:Living Apgars:7 ,9  Weight:3530 g                                            Post partum procedures:None Induction:: AROM, Pitocin , and Cytotec  Complications: None Delivery Type: spontaneous vaginal delivery Anesthesia: IV narcotics Placenta: spontaneous To Pathology: No   Prenatal Labs:  ABO, Rh: --/--/A POS (11/30 9359) Antibody: NEG (11/30 0640) Rubella:  imm / VZ Imm RPR:   NR HBsAg:   neg , Hep C NR HIV:   neg  GBS:   neg   Magnesium  Sulfate received: No BMZ received: No Rhophylac:was not indicated MMR: was not indicated Varivax vaccine given: was not indicated Tdap vaccine: Given prenatally Flu vaccine: declined  RSV vaccine: declined   Transfusion:No  Physical exam  Vitals:   11/15/24 2322 11/16/24 0250 11/16/24 0759 11/16/24 1223  BP: 123/70 131/77 117/71 129/66  Pulse: (!) 101 (!) 106 88 97  Resp: 18 18 18 18   Temp: 97.8 F (36.6 C) 98.1 F (36.7 C) 97.6 F (36.4 C)   TempSrc: Oral Oral Oral   SpO2: 99% 98% 100% 99%  Weight:      Height:       General: alert, cooperative, and no distress Lochia: appropriate Uterine Fundus: firm Perineum:minimal edema/intact DVT Evaluation: No evidence of DVT seen on physical exam.  Labs: Lab Results  Component Value Date   WBC 13.3 (H) 11/15/2024   HGB 9.8 (L) 11/15/2024   HCT 29.8 (L) 11/15/2024   MCV 86.6 11/15/2024   PLT 159 11/15/2024      Latest Ref Rng & Units 11/14/2024    4:18 PM  CMP  Glucose 70 - 99 mg/dL 89   BUN 6 - 20 mg/dL 6   Creatinine 9.55 - 8.99 mg/dL 9.57   Sodium 864 - 854 mmol/L 135   Potassium 3.5 - 5.1 mmol/L 4.0   Chloride 98 - 111 mmol/L 103   CO2 22 - 32 mmol/L 20   Calcium 8.9 - 10.3 mg/dL 9.0   Total Protein 6.5 - 8.1 g/dL 6.4   Total Bilirubin 0.0 - 1.2 mg/dL <9.7   Alkaline Phos 38 - 126 U/L 187  AST 15 - 41 U/L 17   ALT 0 - 44 U/L 11    Edinburgh Score:    11/15/2024   11:56 AM  Edinburgh Postnatal Depression Scale Screening Tool  I have been able to laugh and see the funny side of things. 0  I have looked forward with enjoyment to things. 0  I have blamed myself unnecessarily when things went wrong. 0  I have been anxious or worried for no good reason. 0  I have felt scared or panicky for no good reason. 0  Things have been getting on top of me. 0  I have been so unhappy that I have had difficulty sleeping. 0  I have felt sad or miserable. 0  I have been so unhappy that I have been crying. 0  The thought  of harming myself has occurred to me. 0  Edinburgh Postnatal Depression Scale Total 0     Postpartum VTE Prophylaxis  Recommend 6 weeks of prophylactic anticoagulation with LMWH or subcutaneous unfractionated heparin if 1 or more high risk factor is present.  Recommend 14 days of prophylactic anticoagulation with LMWH or subcutaneous unfractionated heparin if 3 or more moderate risk factors are present.   Risk assessment for postpartum VTE and prophylactic treatment: High risk factors: None Moderate risk factors: BMI 40-60 kg/m2  Postpartum VTE prophylaxis with LMWH not indicated    After visit meds:  Allergies as of 11/16/2024   No Known Allergies      Medication List     STOP taking these medications    aspirin EC 81 MG tablet   QUEtiapine 25 MG tablet Commonly known as: SEROQUEL       TAKE these medications    acetaminophen  325 MG tablet Commonly known as: Tylenol  Take 2 tablets (650 mg total) by mouth every 4 (four) hours as needed (for pain scale < 4).   gabapentin  300 MG capsule Commonly known as: NEURONTIN  Take 300 mg by mouth 3 (three) times daily.   ibuprofen  600 MG tablet Commonly known as: ADVIL  Take 1 tablet (600 mg total) by mouth every 6 (six) hours as needed for mild pain (pain score 1-3), moderate pain (pain score 4-6) or cramping.   labetalol  200 MG tablet Commonly known as: NORMODYNE  Take 1 tablet (200 mg total) by mouth 2 (two) times daily.   oxyCODONE  5 MG immediate release tablet Commonly known as: Oxy IR/ROXICODONE  Take 1 tablet (5 mg total) by mouth every 6 (six) hours as needed for breakthrough pain (pain scale 4-7).   prenatal multivitamin Tabs tablet Take 1 tablet by mouth daily at 12 noon.       Discharge home in stable condition Infant Feeding: Breast Infant Disposition:home with mother Discharge instruction: per After Visit Summary and Postpartum booklet. Activity: Advance as tolerated. Pelvic rest for 6 weeks.  Diet:  routine diet Anticipated Birth Control:  Contraceptives: IUD Paragard Postpartum Appointment:6 weeks Additional Postpartum F/U: BP check 3-5 days Future Appointments:No future appointments. Follow up Visit:  Follow-up Information     [facility]. Schedule an appointment as soon as possible for a visit in 3 day(s).   Why: blood pressure check Contact information: [address] [place]  [address] [phone]                Plan:  Brooke Bauer was discharged to home in good condition. Follow-up appointment as directed.    Signed:  Therisa Pillow, CNM Certified Nurse Midwife [facility]   [facility]

## 2024-11-14 NOTE — Progress Notes (Signed)
 S: Patient is a 28 year old pregnant female at [redacted] weeks gestation here for induction of labor for chronic hypertension.  O: Blood pressure (!) 140/73, pulse (!) 102, temperature 36.8 C, temperature source Oral, resp. rate 16, height 5' 4 (1.626 m), weight 131.5 kg, last menstrual period 02/20/2024, unknown if currently breastfeeding.  HEENT: MP 3, good neck range of motion, CV: RRR, PULM: normal respiratory effort, EXT: moving all 4 ext  A/P:  Patient was not seen by anesthesia prior to her induction despite her BMI being greater than 45, so I came up to assess her airway.  Patient has a reasonable airway in case of the need for emergent c-section with intubation, so she is appropriate for delivery at [facility].  Patient did not desire to talk about pain management options at this time.

## 2024-11-14 NOTE — H&P (Signed)
 Brooke Bauer is a 28 y.o. female presenting for IOL for CHTN on labetalol  OB History     Gravida  2   Para  1   Term  1   Preterm      AB      Living  1      SAB      IAB      Ectopic      Multiple  0   Live Births  1          Past Medical History:  Diagnosis Date   Anxiety    Carpal tunnel syndrome    Depression    no meds x 5 years   Hypertension    Past Surgical History:  Procedure Laterality Date   APPENDECTOMY  2013   Family History: family history includes Hypertension in her father, maternal grandfather, and mother. Social History:  reports that she has been smoking cigarettes. She has never used smokeless tobacco. She reports that she does not drink alcohol and does not use drugs.     Maternal Diabetes: No Genetic Screening: Normal Maternal Ultrasounds/Referrals: Normal Fetal Ultrasounds or other Referrals:  None Maternal Substance Abuse:  No Significant Maternal Medications:  Meds include: Other: labetalol Significant Maternal Lab Results:  None Number of Prenatal Visits:greater than 3 verified prenatal visits Maternal Vaccinations: Other Comments:  None  Review of Systems History Dilation: 1 Effacement (%): Thick Station: -3 Exam by:: Excell Alvine, RN Blood pressure (!) 140/73, pulse (!) 102, temperature 98.2 F (36.8 C), temperature source Oral, resp. rate 16, height 5' 4 (1.626 m), weight 131.5 kg, last menstrual period 02/20/2024, unknown if currently breastfeeding. Exam Physical Exam  Lungs cta   EFM : 140+ accels , no decels  Cat 1  Ctx 1-2 min   CV RRR  Cx ( me ) AROM : clear . 1-2/60/-1 VTX  Prenatal labs: ABO, Rh: --/--/A POS (11/30 9359) Antibody: NEG (11/30 0640) Rubella:  imm / VZ Imm RPR:   NR HBsAg:   neg , Hep C NR HIV:   neg  GBS:   neg   Assessment/Plan: IOL for Chtn  on labetaolol Cytotec  this am now  AROM with clear clear fluid . IUPC placed . Start Pitocin  high dose   Jamis Kryder J  Maleyah Evans 11/14/2024, 1:37 PM

## 2024-11-15 LAB — CBC
HCT: 29.8 % — ABNORMAL LOW (ref 36.0–46.0)
Hemoglobin: 9.8 g/dL — ABNORMAL LOW (ref 12.0–15.0)
MCH: 28.5 pg (ref 26.0–34.0)
MCHC: 32.9 g/dL (ref 30.0–36.0)
MCV: 86.6 fL (ref 80.0–100.0)
Platelets: 159 K/uL (ref 150–400)
RBC: 3.44 MIL/uL — ABNORMAL LOW (ref 3.87–5.11)
RDW: 13.7 % (ref 11.5–15.5)
WBC: 13.3 K/uL — ABNORMAL HIGH (ref 4.0–10.5)
nRBC: 0 % (ref 0.0–0.2)

## 2024-11-15 LAB — SYPHILIS: RPR W/REFLEX TO RPR TITER AND TREPONEMAL ANTIBODIES, TRADITIONAL SCREENING AND DIAGNOSIS ALGORITHM: RPR Ser Ql: NONREACTIVE

## 2024-11-15 MED ORDER — PROPRANOLOL HCL ER 80 MG PO CP24: 200.0000 mg | ORAL_CAPSULE | Freq: Two times a day (BID) | ORAL | Status: DC

## 2024-11-15 MED ORDER — LABETALOL HCL 200 MG PO TABS
200.0000 mg | ORAL_TABLET | Freq: Two times a day (BID) | ORAL | Status: DC
  Administered 2024-11-15 – 2024-11-16 (×3): 200 mg via ORAL
  Filled 2024-11-15 (×3): qty 1

## 2024-11-15 MED ORDER — GABAPENTIN 300 MG PO CAPS
300.0000 mg | ORAL_CAPSULE | Freq: Three times a day (TID) | ORAL | Status: DC
  Administered 2024-11-15 – 2024-11-16 (×4): 300 mg via ORAL
  Filled 2024-11-15 (×4): qty 1

## 2024-11-15 NOTE — Lactation Note (Signed)
 This note was copied from a baby's chart. Lactation Consultation Note  Patient Name: Brooke Bauer Date: 11/15/2024 Age:28 hours Reason for consult: Follow-up assessment;Early term 37-38.6wks   Maternal Data Has patient been taught Hand Expression?: Yes Does the patient have breastfeeding experience prior to this delivery?: Yes How long did the patient breastfeed?: 6 months  Follow up assessment w/ a 23hr old baby Brooke and parents.  Mom stated that feedings are going well.  She breastfed her first infant for 6 months.  Patient w/ a hx of depression, anxiety and carpal tunnel syndrome.   Mom verbalized to [facility] that she has a pump and a Hakaa.  She stated that with her first child she noticed her body never responded well to pumping .   Feeding Mother's Current Feeding Choice: Breast Milk  Interventions Interventions: Breast feeding basics reviewed;Education;CDC milk storage guidelines  LC reviewed the importance of feeding infant at least 8x w/in a 24hr period.  Education was provided on flange sizing before she discharges.  Mom verbalized understanding.   Discharge Discharge Education: Engorgement and breast care;Outpatient recommendation;Warning signs for feeding baby Pump: Personal  Education on engorgement prevention/treatment was discussed as well as breastmilk storage guidelines.  LC provided patient with a handout on breastmilk storage guidelines from [facility]. [facility] outpatient lactation services phone number written on the white board in the room.  Patient verbalized understanding.  LC also provided education from the postpartum book about warning signs to look for in a poor feeding.    Consult Status Consult Status: PRN Follow-up type: Call as needed    Glynn Freas S Henryk Ursin 11/15/2024, 11:23 AM

## 2024-11-15 NOTE — Progress Notes (Signed)
 Postpartum Day  1  Subjective: 28 y.o. H7E7997 postpartum day #1 status post normal spontaneous vaginal delivery. She is ambulating, is tolerating po, is voiding spontaneously.  Her pain is well controlled on PO pain medications. Her lochia is less than menses.  Objective: BP 125/66 (BP Location: Right Arm)   Pulse 95   Temp 98 F (36.7 C) (Oral)   Resp 20   Ht 5' 4 (1.626 m)   Wt 131.5 kg   LMP 02/20/2024   SpO2 100%   Breastfeeding Unknown   BMI 49.78 kg/m    Physical Exam:  General: alert, cooperative, and appears stated age Breasts: soft/nontender Pulm: nl effort Abdomen: soft, non-tender, active bowel sounds Uterine Fundus: firm Perineum: minimal edema, repair well approximated Lochia: appropriate DVT Evaluation: No evidence of DVT seen on physical exam. Negative Homan's sign. No cords or calf tenderness. No significant calf/ankle edema.  Recent Labs    11/14/24 0640 11/15/24 0522  HGB 10.7* 9.8*  HCT 32.5* 29.8*  WBC 11.1* 13.3*  PLT 165 159    Assessment/Plan: 28 y.o. H7E7997 Postpartum Day  1  1. Continue routine postpartum care  2. Infant feeding status: breast feeding --Lactation consult PRN for breastfeeding    3. Contraception plan: no method, vs ParaGard  4. Acute blood loss anemia - clinically significant.  --Hemodynamically stable and asymptomatic --Intervention: continue on oral supplementation with ferrous sulfate  325  5. Immunization status:   needs Varicella prior to discharge   6. gHTN: continue Labetalol  200 mg BID, normotensive since 11/30@ 2239  Disposition: continue inpatient postpartum care , plan for discharge home tomorrow    LOS: 1 day   Lutie Pickler, CNM 11/15/2024, 12:46 PM   ----- Bobbette Brunswick Certified Nurse Midwife [facility]

## 2024-11-16 ENCOUNTER — Other Ambulatory Visit: Payer: Self-pay

## 2024-11-16 DIAGNOSIS — O99210 Obesity complicating pregnancy, unspecified trimester: Secondary | ICD-10-CM | POA: Diagnosis present

## 2024-11-16 MED ORDER — ACETAMINOPHEN 325 MG PO TABS: 650.0000 mg | ORAL_TABLET | ORAL | Status: AC | PRN

## 2024-11-16 MED ORDER — LABETALOL HCL 200 MG PO TABS
200.0000 mg | ORAL_TABLET | Freq: Two times a day (BID) | ORAL | 3 refills | Status: AC
  Filled 2024-11-16: qty 60, 30d supply, fill #0

## 2024-11-16 MED ORDER — OXYCODONE HCL 5 MG PO TABS
5.0000 mg | ORAL_TABLET | Freq: Four times a day (QID) | ORAL | 0 refills | Status: AC | PRN
  Filled 2024-11-16: qty 12, 3d supply, fill #0

## 2024-11-16 MED ORDER — IBUPROFEN 600 MG PO TABS
600.0000 mg | ORAL_TABLET | Freq: Four times a day (QID) | ORAL | 0 refills | Status: AC | PRN
  Filled 2024-11-16: qty 30, 8d supply, fill #0

## 2024-11-16 NOTE — Lactation Note (Addendum)
 This note was copied from a baby's chart. Lactation Consultation Note  Patient Name: Brooke Bauer Date: 11/16/2024 Age:27 hours Reason for consult: Follow-up assessment   Maternal Data   This was a follow-up assessment with mom and support person - baby was not present in the room as he was receiving a circumcision. Mom reported milk flow and stated that she feels as though her milk has transitioned. Mom reported visibly seeing the milk flow as she attempted to hand express prior to feeding baby. Mom disclosed increased comfort with breastfeeding, particularly when using the Boppy for additional support. Mom did not disclose any current pain with the baby's latch.   Feeding Mother's Current Feeding Choice: Breast Milk  Interventions Interventions: Education  Mom and support person were provided with pertinent discharge information.  Discharge Discharge Education: Engorgement and breast care;Warning signs for feeding baby;Outpatient recommendation  Discharge education was provided to both mom and support person. This information included the discharge booklet as a helpful resource, infant hunger cues, particularly after a circumcision, and outpatient consultant information. Mom verbalized her understanding of all questions, and neither mom nor support person mentioned any additional questions.  Consult Status Consult Status: Complete Follow-up type: Call as needed   [facility] 11/16/2024, 11:27 AM

## 2024-11-16 NOTE — Discharge Instructions (Signed)
For blood pressure monitoring at home: Please check your blood pressure at home daily and write down what it is so that you can review it with your provider  Make sure you are able to sit for 15 minutes prior to taking it  If blood pressure 160/110 or greater repeat in 15 minutes.  If still 160/110 or greater come to the Emergency Department for evaluation.  If it goes down please contact the on call provider to check in.   Please go to the ED for severe range blood pressures (160/110 or higher), moderate to severe headache that is not relieved with Tylenol, chest pain, shortness of breath, changes in vision, or persistent pain in upper right abdomen.  Your medication may cause a mild headache or dizziness as you get used to it.  Please make sure that you are drinking plenty of water to help with this side effect.  Drinking 8-12 ounces of water with you medication, if you have a headache, and staying hydrated throughout the day can often help with side effects.   Vaginal Delivery, Care After Refer to this sheet in the next few weeks. These discharge instructions provide you with information on caring for yourself after delivery. Your caregiver may also give you specific instructions. Your treatment has been planned according to the most current medical practices available, but problems sometimes occur. Call your caregiver if you have any problems or questions after you go home. HOME CARE INSTRUCTIONS Take over-the-counter or prescription medicines only as directed by your caregiver or pharmacist. Do not drink alcohol, especially if you are breastfeeding or taking medicine to relieve pain. Do not smoke tobacco. Continue to use good perineal care. Good perineal care includes: Wiping your perineum from front to back Keeping your perineum clean. You can do sitz baths twice a day, to help keep this area clean Do not use tampons, douche or have sex until your caregiver says it is okay. Shower only and  avoid sitting in submerged water, aside from sitz baths Wear a well-fitting bra that provides breast support. Eat healthy foods. Drink enough fluids to keep your urine clear or pale yellow. Eat high-fiber foods such as whole grain cereals and breads, brown rice, beans, and fresh fruits and vegetables every day. These foods may help prevent or relieve constipation. Avoid constipation with high fiber foods or medications, such as miralax or metamucil Follow your caregiver's recommendations regarding resumption of activities such as climbing stairs, driving, lifting, exercising, or traveling. Talk to your caregiver about resuming sexual activities. Resumption of sexual activities is dependent upon your risk of infection, your rate of healing, and your comfort and desire to resume sexual activity. Try to have someone help you with your household activities and your newborn for at least a few days after you leave the hospital. Rest as much as possible. Try to rest or take a nap when your newborn is sleeping. Increase your activities gradually. Keep all of your scheduled postpartum appointments. It is very important to keep your scheduled follow-up appointments. At these appointments, your caregiver will be checking to make sure that you are healing physically and emotionally. SEEK MEDICAL CARE IF:  You are passing large clots from your vagina. Save any clots to show your caregiver. You have a foul smelling discharge from your vagina. You have trouble urinating. You are urinating frequently. You have pain when you urinate. You have a change in your bowel movements. You have increasing redness, pain, or swelling near your vaginal incision (  episiotomy) or vaginal tear. You have pus draining from your episiotomy or vaginal tear. Your episiotomy or vaginal tear is separating. You have painful, hard, or reddened breasts. You have a severe headache. You have blurred vision or see spots. You feel sad or  depressed. You have thoughts of hurting yourself or your newborn. You have questions about your care, the care of your newborn, or medicines. You are dizzy or light-headed. You have a rash. You have nausea or vomiting. You were breastfeeding and have not had a menstrual period within 12 weeks after you stopped breastfeeding. You are not breastfeeding and have not had a menstrual period by the 12th week after delivery. You have a fever. SEEK IMMEDIATE MEDICAL CARE IF:  You have persistent pain. You have chest pain. You have shortness of breath. You faint. You have leg pain. You have stomach pain. Your vaginal bleeding saturates two or more sanitary pads in 1 hour. MAKE SURE YOU:  Understand these instructions. Will watch your condition. Will get help right away if you are not doing well or get worse. Document Released: 11/29/2000 Document Revised: 04/18/2014 Document Reviewed: 07/29/2012 [facility] Patient Information 2015 [place], [place]. This information is not intended to replace advice given to you by your health care provider. Make sure you discuss any questions you have with your health care provider.  Sitz Bath A sitz bath is a warm water bath taken in the sitting position. The water covers only the hips and butt (buttocks). We recommend using one that fits in the toilet, to help with ease of use and cleanliness. It may be used for either healing or cleaning purposes. Sitz baths are also used to relieve pain, itching, or muscle tightening (spasms). The water may contain medicine. Moist heat will help you heal and relax.  HOME CARE  Take 3 to 4 sitz baths a day. Fill the bathtub half-full with warm water. Sit in the water and open the drain a little. Turn on the warm water to keep the tub half-full. Keep the water running constantly. Soak in the water for 15 to 20 minutes. After the sitz bath, pat the affected area dry. GET HELP RIGHT AWAY IF: You get worse instead of better.  Stop the sitz baths if you get worse. MAKE SURE YOU: Understand these instructions. Will watch your condition. Will get help right away if you are not doing well or get worse. Document Released: 01/09/2005 Document Revised: 08/26/2012 Document Reviewed: 04/01/2011 [facility] Patient Information 2015 [place], [place]. This information is not intended to replace advice given to you by your health care provider. Make sure you discuss any questions you have with your health care provider.

## 2025-01-04 ENCOUNTER — Other Ambulatory Visit: Payer: Self-pay

## 2025-01-18 ENCOUNTER — Other Ambulatory Visit: Payer: Self-pay

## 2025-01-18 ENCOUNTER — Ambulatory Visit: Admission: RE | Admit: 2025-01-18 | Discharge: 2025-01-18 | Disposition: A

## 2025-01-18 ENCOUNTER — Encounter: Payer: Self-pay | Admitting: Anesthesiology

## 2025-01-18 ENCOUNTER — Encounter: Admission: RE | Disposition: A | Discharge status: Home / Self Care | Payer: Self-pay | Source: Home / Self Care

## 2025-01-18 ENCOUNTER — Ambulatory Visit: Payer: Self-pay

## 2025-01-18 DIAGNOSIS — I10 Essential (primary) hypertension: Secondary | ICD-10-CM | POA: Insufficient documentation

## 2025-01-18 DIAGNOSIS — G5603 Carpal tunnel syndrome, bilateral upper limbs: Secondary | ICD-10-CM | POA: Insufficient documentation

## 2025-01-18 DIAGNOSIS — G709 Myoneural disorder, unspecified: Secondary | ICD-10-CM | POA: Insufficient documentation

## 2025-01-18 DIAGNOSIS — F17210 Nicotine dependence, cigarettes, uncomplicated: Secondary | ICD-10-CM | POA: Insufficient documentation

## 2025-01-18 LAB — POCT PREGNANCY, URINE: Preg Test, Ur: NEGATIVE

## 2025-01-18 MED ORDER — MIDAZOLAM HCL 2 MG/2ML IJ SOLN
INTRAMUSCULAR | Status: AC
  Filled 2025-01-18: qty 2

## 2025-01-18 MED ORDER — 0.9 % SODIUM CHLORIDE (POUR BTL) OPTIME
TOPICAL | Status: DC | PRN
  Administered 2025-01-18: 500 mL

## 2025-01-18 MED ORDER — SEVOFLURANE IN SOLN
RESPIRATORY_TRACT | Status: AC
  Filled 2025-01-18: qty 250

## 2025-01-18 MED ORDER — ONDANSETRON HCL 4 MG/2ML IJ SOLN
INTRAMUSCULAR | Status: AC
  Filled 2025-01-18: qty 2

## 2025-01-18 MED ORDER — GLYCOPYRROLATE 0.2 MG/ML IJ SOLN
INTRAMUSCULAR | Status: AC
  Filled 2025-01-18: qty 3

## 2025-01-18 MED ORDER — LIDOCAINE HCL (CARDIAC) PF 100 MG/5ML IV SOSY
PREFILLED_SYRINGE | INTRAVENOUS | Status: DC | PRN
  Administered 2025-01-18: 50 mg via INTRAVENOUS

## 2025-01-18 MED ORDER — PROPOFOL 500 MG/50ML IV EMUL
INTRAVENOUS | Status: DC | PRN
  Administered 2025-01-18: 120 ug/kg/min via INTRAVENOUS
  Administered 2025-01-18: 30 mg via INTRAVENOUS

## 2025-01-18 MED ORDER — FENTANYL CITRATE (PF) 100 MCG/2ML IJ SOLN
INTRAMUSCULAR | Status: AC
  Filled 2025-01-18: qty 2

## 2025-01-18 MED ORDER — PROPOFOL 10 MG/ML IV BOLUS
INTRAVENOUS | Status: AC
  Filled 2025-01-18: qty 20

## 2025-01-18 MED ORDER — MIDAZOLAM HCL (PF) 2 MG/2ML IJ SOLN
INTRAMUSCULAR | Status: DC | PRN
  Administered 2025-01-18: 2 mg via INTRAVENOUS

## 2025-01-18 MED ORDER — ONDANSETRON HCL 4 MG/2ML IJ SOLN
INTRAMUSCULAR | Status: DC | PRN
  Administered 2025-01-18: 4 mg via INTRAVENOUS

## 2025-01-18 MED ORDER — LACTATED RINGERS IV SOLN: INTRAVENOUS | Status: DC

## 2025-01-18 MED ORDER — FENTANYL CITRATE (PF) 100 MCG/2ML IJ SOLN
INTRAMUSCULAR | Status: DC | PRN
  Administered 2025-01-18: 100 ug via INTRAVENOUS

## 2025-01-18 MED ORDER — LIDOCAINE-EPINEPHRINE 1 %-1:100000 IJ SOLN
INTRAMUSCULAR | Status: AC
  Filled 2025-01-18: qty 1

## 2025-01-18 MED ORDER — DEXAMETHASONE SODIUM PHOSPHATE 4 MG/ML IJ SOLN
INTRAMUSCULAR | Status: AC
  Filled 2025-01-18: qty 3

## 2025-01-18 NOTE — Discharge Instructions (Signed)
 Discharge Instructions After Surgery   Diet   Return to your regular diet as tolerated.   Dressings   You may remove your dressings after 3-4 days.  Okay to shower at this point but do not soak in water (baths, swimming, etc).  It is okay to have a small spot of blood on your dressing as long as it does not keep getting bigger.   In the first 3-4 days, cover the dressing and keep it dry in the shower or bath. Keep your arm up and out of the water.   Do not put any ointments, creams, alcohol , or hydrogen peroxide on your incisions.  Activity   If your fingers are free, work on making a full fist and straightening out the fingers within the limits of your dressing.  Move other joints such as your elbow or shoulder multiple times a day to prevent getting stiff.  No lifting, carrying, pushing, or pulling with your arm until your follow-up visit. You may use your hand or fingers for simple things like writing, typing, eating, and brushing your teeth.   If you were given a sling, wear it while your hand or arm is numb. You can stop wearing the sling when you have feeling and strength back in your hand or arm. You may also use the sling as needed to elevate, protect, or support your arm until your follow-up visit.   Pain   The first 48 hours after surgery can hurt the most. You should use the different types of pain medicine along with rest and elevation to help keep the pain manageable.   NSAIDS such as ibuprofen (Advil or Motrin) or naproxen  (Aleve ) can help with pain and swelling.   Acetaminophen  (Tylenol ) can be used with NSAIDs. Do not take more than 3000 mg of Tylenol  in a 24-hour period.   Narcotic pain medicine such as hydrocodone  or oxycodone , should be used as prescribed. Stop taking as soon as possible. Take with food to help prevent nausea. Do not drink alcohol  or drive while taking narcotics. Be aware that hydrocodone  tablets often already contain acetaminophen  (Tylenol ).   Elevating your operative extremity above the level of your heart will help with pain and swelling. You can rest your arm on several pillows for support while sleeping or resting.   Constipation   Narcotic pain medicines, changes in eating and drinking, and less activity may cause constipation after surgery.   Over-the-counter stool softeners like docusate (Colace), Senna, or Miralax can help. Follow the directions on the bottle.   Stay hydrated and try to eat high fiber foods.   When to call your surgeon   If your dressing gets wet or very dirty.   Fever more than 101 F.   Incision that is very red, swollen, draining pus, or feels hot.   Severe pain, not getting better with pain medicine.   Severe swelling, even with elevation.   If your fingers become cool, cold, or dark in color.   Bleeding that is getting bigger or soaking through the dressing.   Severe nausea and vomiting.   Problems using the bathroom or no bowel movement for 2-3 days.   For questions or concerns, please call [phone].  Jackquline CANDIE Barrack, MD Orthopaedic Surgery [facility]

## 2025-01-18 NOTE — Op Note (Signed)
 Date of procedure:  01/18/2025  Surgeon: Jackquline CANDIE Barrack, MD   Procedure(s) performed:   Left carpal tunnel release (CPT [id number])  Assistant(s): none  Preoperative diagnosis:   Left carpal tunnel syndrome   Postoperative diagnosis:   Left carpal tunnel syndrome   Procedure findings:  tight carpal tunnel  Anesthesia:  Local + MAC   Estimated blood loss:  Minimal   Specimen:  None   Complications:  None apparent   Implants:  None   Indications: 29 y.o. year old female with left carpal tunnel syndrome that failed conservative management.  We discussed operative treatment and explained the risks, benefits, and alternatives as well as the expected postoperative course.  The patient elected to proceed with surgery.   Procedure in detail:  The patient was met in the pre-op holding area and the left upper extremity was marked and the consent confirmed.  A local field block with 8 mL 1% lidocaine  with epinephrine  was administered for surgical anesthesia.  The patient was then taken to the operating room and the hand table was attached to the stretcher.  Antibiotics were not indicated.  All bony prominences were well padded.  The operative extremity was then prepped and draped in the usual sterile fashion.  A timeout was performed confirming the correct patient, site, and procedure.   A longitudinal incision was made in line with the radial aspect of the ring finger and extending from just distal to the wrist flexion crease to Kaplan's cardinal line.  Sharp dissection was carried down through the palmar fascia until the transverse carpal ligament was encountered.  Overlying thenar muscle was swept radially off of the transverse carpal ligament.  The transverse carpal ligament was then carefully incised and released in its entirety, distally to the fat pad surrounding the superficial palmar arch and proximally to the antebrachial fascia.  The distal aspect of the antebrachial fascia was released  with scissors under direct visualization.    The wound was irrigated with normal saline.  The incision was closed with 4-0 Nylon.  Sterile dressings were applied.  The patient was transferred to the PACU in stable condition.  Counts were correct x2.

## 2025-01-18 NOTE — Anesthesia Postprocedure Evaluation (Signed)
"   Anesthesia Post Note  Patient: Brooke Bauer  Procedure(s) Performed: CARPAL TUNNEL RELEASE (Left: Wrist)  Anesthesia Type: MAC Anesthetic complications: no   No notable events documented.   Last Vitals:  Vitals:   01/18/25 0810 01/18/25 0815  BP:  (!) 95/55  Pulse: 85 74  Resp: 16 (!) 23  Temp:    SpO2: 94% 97%    Last Pain:  Vitals:   01/18/25 0800  TempSrc:   PainSc: 0-No pain                 Redell MARLA Breaker      "

## 2025-01-18 NOTE — Transfer of Care (Signed)
 Immediate Anesthesia Transfer of Care Note  Patient: Brooke Bauer  Procedure(s) Performed: CARPAL TUNNEL RELEASE (Left: Wrist)  Patient Location: PACU  Anesthesia Type: MAC  Level of Consciousness: awake, alert  and patient cooperative  Airway and Oxygen Therapy: Patient Spontanous Breathing and Patient connected to supplemental oxygen  Post-op Assessment: Post-op Vital signs reviewed, Patient's Cardiovascular Status Stable, Respiratory Function Stable, Patent Airway and No signs of Nausea or vomiting  Post-op Vital Signs: Reviewed and stable  Complications: No notable events documented.
